# Patient Record
Sex: Male | Born: 1935 | Race: White | Hispanic: No | Marital: Married | Smoking: Never smoker
Health system: Southern US, Community
[De-identification: ages and names within clinical notes are randomized; demographics above are authoritative.]

## PROBLEM LIST (undated history)

## (undated) DIAGNOSIS — D759 Disease of blood and blood-forming organs, unspecified: Secondary | ICD-10-CM

## (undated) DIAGNOSIS — Z86718 Personal history of other venous thrombosis and embolism: Secondary | ICD-10-CM

## (undated) DIAGNOSIS — R52 Pain, unspecified: Secondary | ICD-10-CM

## (undated) DIAGNOSIS — R39198 Other difficulties with micturition: Secondary | ICD-10-CM

## (undated) DIAGNOSIS — H9190 Unspecified hearing loss, unspecified ear: Secondary | ICD-10-CM

## (undated) DIAGNOSIS — J189 Pneumonia, unspecified organism: Secondary | ICD-10-CM

## (undated) HISTORY — PX: JOINT REPLACEMENT: SHX530

---

## 2005-03-02 ENCOUNTER — Ambulatory Visit: Payer: Self-pay | Admitting: Internal Medicine

## 2005-05-04 ENCOUNTER — Ambulatory Visit (HOSPITAL_COMMUNITY): Admission: RE | Admit: 2005-05-04 | Discharge: 2005-05-04 | Payer: Self-pay | Admitting: Internal Medicine

## 2005-06-01 ENCOUNTER — Ambulatory Visit: Payer: Self-pay | Admitting: Internal Medicine

## 2006-02-11 ENCOUNTER — Inpatient Hospital Stay (HOSPITAL_COMMUNITY): Admission: RE | Admit: 2006-02-11 | Discharge: 2006-02-14 | Payer: Self-pay | Admitting: Orthopedic Surgery

## 2006-05-23 ENCOUNTER — Inpatient Hospital Stay (HOSPITAL_COMMUNITY): Admission: RE | Admit: 2006-05-23 | Discharge: 2006-05-26 | Payer: Self-pay | Admitting: Orthopedic Surgery

## 2012-10-17 ENCOUNTER — Other Ambulatory Visit: Payer: Self-pay | Admitting: Orthopedic Surgery

## 2012-10-17 ENCOUNTER — Encounter (HOSPITAL_COMMUNITY): Payer: Self-pay | Admitting: *Deleted

## 2012-10-17 ENCOUNTER — Encounter (HOSPITAL_COMMUNITY): Payer: Self-pay

## 2012-10-17 DIAGNOSIS — S76119A Strain of unspecified quadriceps muscle, fascia and tendon, initial encounter: Secondary | ICD-10-CM

## 2012-10-17 MED ORDER — DEXAMETHASONE SODIUM PHOSPHATE 10 MG/ML IJ SOLN: 10.0000 mg | Freq: Once | INTRAMUSCULAR | Status: DC

## 2012-10-17 MED ORDER — BUPIVACAINE LIPOSOME 1.3 % IJ SUSP: 20.0000 mL | Freq: Once | INTRAMUSCULAR | Status: DC

## 2012-10-17 NOTE — Progress Notes (Signed)
PT ADDED ON FOR SURGERY TOMORROW.  SPOKE WITH PT BY PHONE - HE IS HARD OF HEARING-REQUESTED I TALK WITH HIS DAUGHTER TANYA TANT - WHO IS THERE AT HIS HOUSE WITH HIM.  PT'S MEDICAL HX AND INSTRUCTIONS FOR SURGERY DISCUSSED WITH TANYA - INCLUDING  BATHING WITH HIBICLENS / PRECAUTIONS USING.

## 2012-10-17 NOTE — H&P (Signed)
  CC- Ian Cunningham is a 79 y.o. male who presents with left knee pain.  HPI- . Knee Pain: Patient presents with knee pain involving the  left knee. Onset of the symptoms was several days ago. Inciting event: was walking and doing stairs and developed weakness, felt a pop and developed significant swelling. Current symptoms include giving out, swelling and inability to bear weight or extend knee. Pain is aggravated by any weight bearing, standing and twisting while sitting or lying down.  Patient has had prior knee problems and had a total knee arthroplasty about 7 years ago with absolutely no problems until this episode. Evaluation to date: plain films: normal and ultrasound showing quad tendon rupture. Treatment to date: brace which is somewhat effective.  Past Medical History  Diagnosis Date  . History of blood clots     clots in legs and arms over the years- due to factor v leiden --no problems since taking coumadin --has been on coumadin for at least 10 yrs  . Blood dyscrasia     Factor V Leiden - takes chronic coumadin to prevent blood clots  . Pneumonia     about 2011  . Hard of hearing     does not have hearing aids  . Pain     left leg - ruptured quadriceps tendon--has immobilizer and crutches  . Difficulty in voiding     was put on flomax several years ago    Past Surgical History  Procedure Laterality Date  . Joint replacement      bilateral total knee replacements    Prior to Admission medications   Medication Sig Start Date End Date Taking? Authorizing Provider  Cyanocobalamin (VITAMIN B 12 PO) Take 1,000 mcg by mouth every morning.   Yes Historical Provider, MD  Oxycodone HCl 10 MG TABS Take by mouth. One to two capsules every 4 to 6 hours   Yes Historical Provider, MD  tamsulosin (FLOMAX) 0.4 MG CAPS Take by mouth. One at bedtime daily   Yes Historical Provider, MD  warfarin (COUMADIN) 5 MG tablet Take 2.5-5 mg by mouth See admin instructions. Pt takes 2.5mg  on Monday  Wednesday Friday and on the other days pt takes 5 mg.   Yes Historical Provider, MD   KNEE EXAM antalgic gait, soft tissue tenderness over anterior knee with palpable quad tendon defect, effusion, collateral ligaments intact, no warmth, erythema; inability to actively extend knee beyond 40 degrees with full passive extension Physical Examination: General appearance - alert, well appearing, and in no distress Mental status - alert, oriented to person, place, and time Chest - clear to auscultation, no wheezes, rales or rhonchi, symmetric air entry Heart - normal rate, regular rhythm, normal S1, S2, no murmurs, rubs, clicks or gallops Abdomen - soft, nontender, nondistended, no masses or organomegaly Neurological - alert, oriented, normal speech, no focal findings or movement disorder noted   Asessment/Plan--- Left knee quad tendon rupture- - Plan left knee quad tendon repair. Procedure risks and potential comps discussed with patient who elects to proceed. Goals are decreased pain and increased function with a high likelihood of achieving both

## 2012-10-18 ENCOUNTER — Encounter (HOSPITAL_COMMUNITY)
Admission: RE | Disposition: A | Discharge status: Home / Self Care | Payer: Self-pay | Source: Ambulatory Visit | Attending: Orthopedic Surgery

## 2012-10-18 ENCOUNTER — Encounter (HOSPITAL_COMMUNITY): Payer: Self-pay | Admitting: Certified Registered Nurse Anesthetist

## 2012-10-18 ENCOUNTER — Ambulatory Visit (HOSPITAL_COMMUNITY): Payer: Medicare Other | Admitting: Certified Registered Nurse Anesthetist

## 2012-10-18 ENCOUNTER — Ambulatory Visit (HOSPITAL_COMMUNITY): Payer: Medicare Other

## 2012-10-18 ENCOUNTER — Encounter (HOSPITAL_COMMUNITY): Payer: Self-pay | Admitting: *Deleted

## 2012-10-18 ENCOUNTER — Observation Stay (HOSPITAL_COMMUNITY)
Admission: RE | Admit: 2012-10-18 | Discharge: 2012-10-19 | Disposition: A | Discharge status: Home / Self Care | Payer: Medicare Other | Source: Ambulatory Visit | Attending: Orthopedic Surgery | Admitting: Orthopedic Surgery

## 2012-10-18 DIAGNOSIS — M25069 Hemarthrosis, unspecified knee: Secondary | ICD-10-CM | POA: Insufficient documentation

## 2012-10-18 DIAGNOSIS — M66259 Spontaneous rupture of extensor tendons, unspecified thigh: Principal | ICD-10-CM | POA: Insufficient documentation

## 2012-10-18 DIAGNOSIS — Z86718 Personal history of other venous thrombosis and embolism: Secondary | ICD-10-CM | POA: Insufficient documentation

## 2012-10-18 DIAGNOSIS — Z96659 Presence of unspecified artificial knee joint: Secondary | ICD-10-CM | POA: Insufficient documentation

## 2012-10-18 DIAGNOSIS — S76119A Strain of unspecified quadriceps muscle, fascia and tendon, initial encounter: Secondary | ICD-10-CM

## 2012-10-18 DIAGNOSIS — D6859 Other primary thrombophilia: Secondary | ICD-10-CM | POA: Insufficient documentation

## 2012-10-18 DIAGNOSIS — S8000XA Contusion of unspecified knee, initial encounter: Secondary | ICD-10-CM | POA: Insufficient documentation

## 2012-10-18 DIAGNOSIS — X58XXXA Exposure to other specified factors, initial encounter: Secondary | ICD-10-CM | POA: Insufficient documentation

## 2012-10-18 DIAGNOSIS — M25469 Effusion, unspecified knee: Secondary | ICD-10-CM | POA: Insufficient documentation

## 2012-10-18 DIAGNOSIS — Z7901 Long term (current) use of anticoagulants: Secondary | ICD-10-CM | POA: Insufficient documentation

## 2012-10-18 HISTORY — DX: Personal history of other venous thrombosis and embolism: Z86.718

## 2012-10-18 HISTORY — DX: Unspecified hearing loss, unspecified ear: H91.90

## 2012-10-18 HISTORY — PX: QUADRICEPS TENDON REPAIR: SHX756

## 2012-10-18 HISTORY — DX: Other difficulties with micturition: R39.198

## 2012-10-18 HISTORY — DX: Pain, unspecified: R52

## 2012-10-18 HISTORY — DX: Disease of blood and blood-forming organs, unspecified: D75.9

## 2012-10-18 HISTORY — DX: Pneumonia, unspecified organism: J18.9

## 2012-10-18 LAB — BASIC METABOLIC PANEL
BUN: 27 mg/dL — ABNORMAL HIGH (ref 6–23)
Calcium: 9 mg/dL (ref 8.4–10.5)
Creatinine, Ser: 1.32 mg/dL (ref 0.50–1.35)
GFR calc Af Amer: 58 mL/min — ABNORMAL LOW (ref >90)
GFR calc non Af Amer: 50 mL/min — ABNORMAL LOW (ref >90)

## 2012-10-18 LAB — CBC
HCT: 36.3 % — ABNORMAL LOW (ref 39.0–52.0)
MCH: 29.4 pg (ref 26.0–34.0)
MCHC: 33.3 g/dL (ref 30.0–36.0)
MCV: 88.1 fL (ref 78.0–100.0)
RDW: 12.8 % (ref 11.5–15.5)

## 2012-10-18 SURGERY — REPAIR, TENDON, QUADRICEPS
Anesthesia: General | Site: Knee | Laterality: Left | Wound class: Clean

## 2012-10-18 MED ORDER — METOCLOPRAMIDE HCL 10 MG PO TABS: 5.0000 mg | ORAL_TABLET | Freq: Three times a day (TID) | ORAL | Status: DC | PRN

## 2012-10-18 MED ORDER — VANCOMYCIN HCL IN DEXTROSE 1-5 GM/200ML-% IV SOLN
1000.0000 mg | Freq: Once | INTRAVENOUS | Status: AC
  Administered 2012-10-18: 1000 mg via INTRAVENOUS

## 2012-10-18 MED ORDER — VANCOMYCIN HCL IN DEXTROSE 1-5 GM/200ML-% IV SOLN
INTRAVENOUS | Status: AC
  Filled 2012-10-18: qty 200

## 2012-10-18 MED ORDER — ACETAMINOPHEN 10 MG/ML IV SOLN
INTRAVENOUS | Status: AC
  Filled 2012-10-18: qty 100

## 2012-10-18 MED ORDER — 0.9 % SODIUM CHLORIDE (POUR BTL) OPTIME
TOPICAL | Status: DC | PRN
  Administered 2012-10-18: 1000 mL

## 2012-10-18 MED ORDER — HYDROMORPHONE HCL PF 1 MG/ML IJ SOLN: 0.2500 mg | INTRAMUSCULAR | Status: DC | PRN

## 2012-10-18 MED ORDER — OXYCODONE HCL 5 MG PO TABS
5.0000 mg | ORAL_TABLET | ORAL | Status: DC | PRN
  Administered 2012-10-18 – 2012-10-19 (×3): 5 mg via ORAL
  Filled 2012-10-18: qty 2
  Filled 2012-10-18 (×2): qty 1

## 2012-10-18 MED ORDER — METHOCARBAMOL 100 MG/ML IJ SOLN
500.0000 mg | Freq: Four times a day (QID) | INTRAVENOUS | Status: DC | PRN
  Filled 2012-10-18: qty 5

## 2012-10-18 MED ORDER — LIDOCAINE HCL (CARDIAC) 20 MG/ML IV SOLN
INTRAVENOUS | Status: DC | PRN
  Administered 2012-10-18: 100 mg via INTRAVENOUS

## 2012-10-18 MED ORDER — PROPOFOL 10 MG/ML IV EMUL
INTRAVENOUS | Status: DC | PRN
  Administered 2012-10-18: 150 mg via INTRAVENOUS

## 2012-10-18 MED ORDER — CHLORHEXIDINE GLUCONATE 4 % EX LIQD: 60.0000 mL | Freq: Once | CUTANEOUS | Status: DC

## 2012-10-18 MED ORDER — COUMADIN BOOK
1.0000 | Freq: Once | Status: DC
  Filled 2012-10-18: qty 1

## 2012-10-18 MED ORDER — WARFARIN SODIUM 4 MG PO TABS
4.0000 mg | ORAL_TABLET | ORAL | Status: AC
  Administered 2012-10-18: 4 mg via ORAL
  Filled 2012-10-18: qty 1

## 2012-10-18 MED ORDER — CEFAZOLIN SODIUM-DEXTROSE 2-3 GM-% IV SOLR
2.0000 g | INTRAVENOUS | Status: AC
  Administered 2012-10-18: 2 g via INTRAVENOUS

## 2012-10-18 MED ORDER — WARFARIN - PHARMACIST DOSING INPATIENT: Freq: Every day | Status: DC

## 2012-10-18 MED ORDER — METHOCARBAMOL 500 MG PO TABS
500.0000 mg | ORAL_TABLET | Freq: Four times a day (QID) | ORAL | Status: DC | PRN
  Administered 2012-10-18: 500 mg via ORAL
  Filled 2012-10-18: qty 1

## 2012-10-18 MED ORDER — TEMAZEPAM 15 MG PO CAPS: 15.0000 mg | ORAL_CAPSULE | Freq: Every evening | ORAL | Status: DC | PRN

## 2012-10-18 MED ORDER — SODIUM CHLORIDE 0.9 % IV SOLN
INTRAVENOUS | Status: DC
  Administered 2012-10-18 (×3): via INTRAVENOUS

## 2012-10-18 MED ORDER — TRAMADOL HCL 50 MG PO TABS: 50.0000 mg | ORAL_TABLET | Freq: Four times a day (QID) | ORAL | Status: DC | PRN

## 2012-10-18 MED ORDER — CEFAZOLIN SODIUM-DEXTROSE 2-3 GM-% IV SOLR
INTRAVENOUS | Status: AC
  Filled 2012-10-18: qty 50

## 2012-10-18 MED ORDER — MUPIROCIN 2 % EX OINT
TOPICAL_OINTMENT | Freq: Two times a day (BID) | CUTANEOUS | Status: DC
  Administered 2012-10-18: 1 via NASAL
  Filled 2012-10-18: qty 22

## 2012-10-18 MED ORDER — DEXTROSE 5 % IV SOLN
3.0000 g | INTRAVENOUS | Status: DC
  Filled 2012-10-18: qty 3000

## 2012-10-18 MED ORDER — ONDANSETRON HCL 4 MG/2ML IJ SOLN
INTRAMUSCULAR | Status: DC | PRN
  Administered 2012-10-18: 4 mg via INTRAVENOUS

## 2012-10-18 MED ORDER — SODIUM CHLORIDE 0.9 % IJ SOLN
INTRAMUSCULAR | Status: DC | PRN
  Administered 2012-10-18: 19:00:00

## 2012-10-18 MED ORDER — FENTANYL CITRATE 0.05 MG/ML IJ SOLN
INTRAMUSCULAR | Status: DC | PRN
  Administered 2012-10-18 (×5): 50 ug via INTRAVENOUS

## 2012-10-18 MED ORDER — METOCLOPRAMIDE HCL 5 MG/ML IJ SOLN: 5.0000 mg | Freq: Three times a day (TID) | INTRAMUSCULAR | Status: DC | PRN

## 2012-10-18 MED ORDER — ACETAMINOPHEN 10 MG/ML IV SOLN: 1000.0000 mg | Freq: Once | INTRAVENOUS | Status: DC

## 2012-10-18 MED ORDER — TAMSULOSIN HCL 0.4 MG PO CAPS
0.4000 mg | ORAL_CAPSULE | Freq: Every day | ORAL | Status: DC
  Filled 2012-10-18: qty 1

## 2012-10-18 MED ORDER — MORPHINE SULFATE 2 MG/ML IJ SOLN: 1.0000 mg | INTRAMUSCULAR | Status: DC | PRN

## 2012-10-18 MED ORDER — EPHEDRINE SULFATE 50 MG/ML IJ SOLN
INTRAMUSCULAR | Status: DC | PRN
  Administered 2012-10-18: 10 mg via INTRAVENOUS

## 2012-10-18 MED ORDER — ACETAMINOPHEN 10 MG/ML IV SOLN
INTRAVENOUS | Status: DC | PRN
  Administered 2012-10-18: 1000 mg via INTRAVENOUS

## 2012-10-18 MED ORDER — DEXAMETHASONE SODIUM PHOSPHATE 10 MG/ML IJ SOLN
INTRAMUSCULAR | Status: DC | PRN
  Administered 2012-10-18: 10 mg via INTRAVENOUS

## 2012-10-18 MED ORDER — ONDANSETRON HCL 4 MG PO TABS: 4.0000 mg | ORAL_TABLET | Freq: Four times a day (QID) | ORAL | Status: DC | PRN

## 2012-10-18 MED ORDER — VANCOMYCIN HCL IN DEXTROSE 1-5 GM/200ML-% IV SOLN
1000.0000 mg | Freq: Two times a day (BID) | INTRAVENOUS | Status: AC
  Administered 2012-10-19: 1000 mg via INTRAVENOUS
  Filled 2012-10-18: qty 200

## 2012-10-18 MED ORDER — LACTATED RINGERS IV SOLN: INTRAVENOUS | Status: DC

## 2012-10-18 MED ORDER — LACTATED RINGERS IV SOLN
INTRAVENOUS | Status: DC
  Administered 2012-10-18: 17:00:00 via INTRAVENOUS

## 2012-10-18 MED ORDER — SODIUM CHLORIDE 0.9 % IV SOLN
INTRAVENOUS | Status: DC
  Administered 2012-10-18: 22:00:00 via INTRAVENOUS

## 2012-10-18 MED ORDER — BUPIVACAINE LIPOSOME 1.3 % IJ SUSP
20.0000 mL | Freq: Once | INTRAMUSCULAR | Status: DC
  Filled 2012-10-18: qty 20

## 2012-10-18 MED ORDER — MIDAZOLAM HCL 5 MG/5ML IJ SOLN
INTRAMUSCULAR | Status: DC | PRN
  Administered 2012-10-18: 2 mg via INTRAVENOUS

## 2012-10-18 MED ORDER — ONDANSETRON HCL 4 MG/2ML IJ SOLN: 4.0000 mg | Freq: Four times a day (QID) | INTRAMUSCULAR | Status: DC | PRN

## 2012-10-18 MED ORDER — WARFARIN VIDEO: Freq: Once | Status: DC

## 2012-10-18 SURGICAL SUPPLY — 48 items
BAG SPEC THK2 15X12 ZIP CLS (MISCELLANEOUS)
BAG ZIPLOCK 12X15 (MISCELLANEOUS) IMPLANT
BANDAGE ELASTIC 6 VELCRO ST LF (GAUZE/BANDAGES/DRESSINGS) ×2 IMPLANT
BANDAGE ESMARK 6X9 LF (GAUZE/BANDAGES/DRESSINGS) IMPLANT
BIT DRILL 2.8X5 QR DISP (BIT) ×2 IMPLANT
BNDG CMPR 9X6 STRL LF SNTH (GAUZE/BANDAGES/DRESSINGS)
BNDG ESMARK 6X9 LF (GAUZE/BANDAGES/DRESSINGS)
CLOTH BEACON ORANGE TIMEOUT ST (SAFETY) ×2 IMPLANT
DRAPE POUCH INSTRU U-SHP 10X18 (DRAPES) ×2 IMPLANT
DRAPE U-SHAPE 47X51 STRL (DRAPES) ×2 IMPLANT
DRSG EMULSION OIL 3X16 NADH (GAUZE/BANDAGES/DRESSINGS) ×2 IMPLANT
DRSG EMULSION OIL 3X3 NADH (GAUZE/BANDAGES/DRESSINGS) ×1 IMPLANT
DRSG PAD ABDOMINAL 8X10 ST (GAUZE/BANDAGES/DRESSINGS) ×1 IMPLANT
DURAPREP 26ML APPLICATOR (WOUND CARE) ×2 IMPLANT
ELECT REM PT RETURN 9FT ADLT (ELECTROSURGICAL) ×2
ELECTRODE REM PT RTRN 9FT ADLT (ELECTROSURGICAL) ×1 IMPLANT
EVACUATOR 1/8 PVC DRAIN (DRAIN) ×1 IMPLANT
GLOVE BIO SURGEON STRL SZ7.5 (GLOVE) ×2 IMPLANT
GLOVE BIO SURGEON STRL SZ8 (GLOVE) ×2 IMPLANT
GLOVE BIOGEL PI IND STRL 8 (GLOVE) ×2 IMPLANT
GLOVE BIOGEL PI INDICATOR 8 (GLOVE) ×2
GLOVE SURG SS PI 8.5 STRL IVOR (GLOVE) ×4
GLOVE SURG SS PI 8.5 STRL STRW (GLOVE) IMPLANT
GOWN BRE IMP PREV XXLGXLNG (GOWN DISPOSABLE) ×1 IMPLANT
GOWN STRL NON-REIN LRG LVL3 (GOWN DISPOSABLE) ×2 IMPLANT
GOWN STRL REIN XL XLG (GOWN DISPOSABLE) ×3 IMPLANT
IMMOBILIZER KNEE 20 (SOFTGOODS) ×2
IMMOBILIZER KNEE 20 THIGH 36 (SOFTGOODS) ×1 IMPLANT
KIT BASIN OR (CUSTOM PROCEDURE TRAY) ×2 IMPLANT
MANIFOLD NEPTUNE II (INSTRUMENTS) ×2 IMPLANT
NDL MA TROC 1/2 CIR (NEEDLE) ×1 IMPLANT
NEEDLE MA TROC 1/2 CIR (NEEDLE) IMPLANT
NS IRRIG 1000ML POUR BTL (IV SOLUTION) ×2 IMPLANT
PACK ICE MAXI GEL EZY WRAP (MISCELLANEOUS) ×2 IMPLANT
PACK TOTAL JOINT (CUSTOM PROCEDURE TRAY) ×2 IMPLANT
PAD ABD 7.5X8 STRL (GAUZE/BANDAGES/DRESSINGS) ×2 IMPLANT
PADDING CAST COTTON 6X4 STRL (CAST SUPPLIES) ×3 IMPLANT
PASSER SUT SWANSON 36MM LOOP (INSTRUMENTS) ×2 IMPLANT
POSITIONER SURGICAL ARM (MISCELLANEOUS) ×2 IMPLANT
SPONGE GAUZE 4X4 12PLY (GAUZE/BANDAGES/DRESSINGS) ×2 IMPLANT
STRIP CLOSURE SKIN 1/2X4 (GAUZE/BANDAGES/DRESSINGS) ×3 IMPLANT
SUT ETHIBOND 2 OS 4 DA (SUTURE) ×2 IMPLANT
SUT MNCRL AB 4-0 PS2 18 (SUTURE) ×2 IMPLANT
SUT VIC AB 0 CT1 27 (SUTURE) ×2
SUT VIC AB 0 CT1 27XBRD ANTBC (SUTURE) ×2 IMPLANT
SUT VIC AB 2-0 CT1 27 (SUTURE) ×6
SUT VIC AB 2-0 CT1 TAPERPNT 27 (SUTURE) ×2 IMPLANT
TOWEL OR 17X26 10 PK STRL BLUE (TOWEL DISPOSABLE) ×4 IMPLANT

## 2012-10-18 NOTE — Anesthesia Preprocedure Evaluation (Addendum)
Anesthesia Evaluation  Patient identified by MRN, date of birth, ID band Patient awake    Reviewed: Allergy & Precautions, H&P , NPO status , Patient's Chart, lab work & pertinent test results  Airway Mallampati: II TM Distance: >3 FB Neck ROM: full    Dental  (+) Edentulous Upper, Missing and Dental Advisory Given Only 2 teeth left on bottom:   Pulmonary neg pulmonary ROS,  breath sounds clear to auscultation  Pulmonary exam normal       Cardiovascular Exercise Tolerance: Good negative cardio ROS  Rhythm:regular Rate:Normal     Neuro/Psych negative neurological ROS  negative psych ROS   GI/Hepatic negative GI ROS, Neg liver ROS,   Endo/Other  negative endocrine ROS  Renal/GU negative Renal ROS  negative genitourinary   Musculoskeletal   Abdominal   Peds  Hematology negative hematology ROS (+) Blood dyscrasia, , factor V Leiden - on coumadin.  Multiple blood clots   Anesthesia Other Findings   Reproductive/Obstetrics negative OB ROS                          Anesthesia Physical Anesthesia Plan  ASA: III  Anesthesia Plan: General   Post-op Pain Management:    Induction: Intravenous  Airway Management Planned: LMA  Additional Equipment:   Intra-op Plan:   Post-operative Plan:   Informed Consent: I have reviewed the patients History and Physical, chart, labs and discussed the procedure including the risks, benefits and alternatives for the proposed anesthesia with the patient or authorized representative who has indicated his/her understanding and acceptance.   Dental Advisory Given  Plan Discussed with: CRNA and Surgeon  Anesthesia Plan Comments:         Anesthesia Quick Evaluation

## 2012-10-18 NOTE — Plan of Care (Signed)
Problem: Diagnosis - Type of Surgery Goal: General Surgical Patient Education (See Patient Education module for education specifics) 10/18/2012: Repair Left Quadriceps Tendon Repair

## 2012-10-18 NOTE — Transfer of Care (Signed)
Immediate Anesthesia Transfer of Care Note  Patient: Ian Cunningham  Procedure(s) Performed: Procedure(s) (LRB): REPAIR QUADRICEP TENDON (Left)  Patient Location: PACU  Anesthesia Type: General  Level of Consciousness: sedated, patient cooperative and responds to stimulaton  Airway & Oxygen Therapy: Patient Spontanous Breathing and Patient connected to face mask oxgen  Post-op Assessment: Report given to PACU RN and Post -op Vital signs reviewed and stable  Post vital signs: Reviewed and stable  Complications: No apparent anesthesia complications

## 2012-10-18 NOTE — Preoperative (Signed)
Beta Blockers   Reason not to administer Beta Blockers:Not Applicable 

## 2012-10-18 NOTE — Anesthesia Postprocedure Evaluation (Signed)
  Anesthesia Post-op Note  Patient: Ian Cunningham  Procedure(s) Performed: Procedure(s) (LRB): REPAIR QUADRICEP TENDON (Left)  Patient Location: PACU  Anesthesia Type: General  Level of Consciousness: awake and alert   Airway and Oxygen Therapy: Patient Spontanous Breathing  Post-op Pain: mild  Post-op Assessment: Post-op Vital signs reviewed, Patient's Cardiovascular Status Stable, Respiratory Function Stable, Patent Airway and No signs of Nausea or vomiting  Last Vitals:  Filed Vitals:   10/18/12 1429  BP: 140/55  Pulse: 69  Temp: 37.1 C  Resp: 20    Post-op Vital Signs: stable   Complications: No apparent anesthesia complications

## 2012-10-18 NOTE — Brief Op Note (Signed)
10/18/2012  7:18 PM  PATIENT:  Ian Cunningham  79 y.o. male  PRE-OPERATIVE DIAGNOSIS:  LEFT KNEE QUAD TENDON RUPTURE  POST-OPERATIVE DIAGNOSIS:  left knee quadriceps tendon rupture  PROCEDURE:  Repair partial tear left quadriceps tendon SURGEON:  Surgeon(s) and Role:    * Loanne Drilling, MD - Primary  PHYSICIAN ASSISTANT:   ASSISTANTS: Avel Peace, Pa-C   ANESTHESIA:   general  EBL:     BLOOD ADMINISTERED:none  DRAINS: (Medium) Hemovact drain(s) in the left knee with  Suction Open   LOCAL MEDICATIONS USED:  OTHER Exparel 20 ml  COUNTS:  YES  TOURNIQUET: 28 minutes @ 300 mm Hg  DICTATION: .Other Dictation: Dictation Number [id number]  PLAN OF CARE: Admit for overnight observation  PATIENT DISPOSITION:  PACU - hemodynamically stable.

## 2012-10-18 NOTE — Interval H&P Note (Signed)
History and Physical Interval Note:  10/18/2012 5:55 PM  Ian Cunningham  has presented today for surgery, with the diagnosis of LEFT KNEE QUAD TENDON RUPTURE  The various methods of treatment have been discussed with the patient and family. After consideration of risks, benefits and other options for treatment, the patient has consented to  Procedure(s): REPAIR QUADRICEP TENDON (Left) as a surgical intervention .  The patient's history has been reviewed, patient examined, no change in status, stable for surgery.  I have reviewed the patient's chart and labs.  Questions were answered to the patient's satisfaction.     Loanne Drilling

## 2012-10-19 ENCOUNTER — Encounter (HOSPITAL_COMMUNITY): Payer: Self-pay | Admitting: Orthopedic Surgery

## 2012-10-19 MED ORDER — METHOCARBAMOL 500 MG PO TABS: 500.0000 mg | ORAL_TABLET | Freq: Four times a day (QID) | ORAL | Status: DC | PRN

## 2012-10-19 MED ORDER — TRAMADOL HCL 50 MG PO TABS: 50.0000 mg | ORAL_TABLET | Freq: Four times a day (QID) | ORAL | Status: DC | PRN

## 2012-10-19 MED ORDER — POLYETHYLENE GLYCOL 3350 17 G PO PACK
17.0000 g | PACK | Freq: Every day | ORAL | Status: DC
  Administered 2012-10-19: 17 g via ORAL

## 2012-10-19 NOTE — Discharge Summary (Signed)
Physician Discharge Summary   Patient ID: Ian Cunningham MRN: [id number] DOB/AGE: [birth year] 77 y.o.  Admit date: 10/18/2012 Discharge date: 10/19/2012  Primary Diagnosis:  Left knee quadriceps tendon rupture  Admission Diagnoses:  Past Medical History  Diagnosis Date  . History of blood clots     clots in legs and arms over the years- due to factor v leiden --no problems since taking coumadin --has been on coumadin for at least 10 yrs  . Blood dyscrasia     Factor V Leiden - takes chronic coumadin to prevent blood clots  . Pneumonia     about 2011  . Hard of hearing     does not have hearing aids  . Pain     left leg - ruptured quadriceps tendon--has immobilizer and crutches  . Difficulty in voiding     was put on flomax several years ago   Discharge Diagnoses:   Principal Problem:   Quadriceps tendon rupture  Estimated body mass index is 31.49 kg/(m^2) as calculated from the following:   Height as of this encounter: 5\' 6"  (1.676 m).   Weight as of this encounter: 88.451 kg (195 lb).  Procedure:  Procedure(s) (LRB): REPAIR QUADRICEP TENDON (Left)   Consults: None  HPI: Ian Cunningham is a 79 year old male, had a left total knee  arthroplasty done approximately 7 years ago, and had no problems until  this past weekend. He developed a sudden onset of swelling and pain and  inability to extend his knee. He did not have a direct trauma, but did  feel as though he may have had a pop in the knee. He presents to the  office 2 days ago with a large hemarthrosis and inability to extend the  knee. The ultrasound was performed in the office showing a probable  quadriceps tendon rupture. He presents now for repair.  Laboratory Data: Admission on 10/18/2012  Component Date Value Range Status  . MRSA, PCR 10/18/2012 POSITIVE* NEGATIVE Final   Comment: RESULT CALLED TO, READ BACK BY AND VERIFIED WITH:                          SHEPHERD K ON [id number] AT 1700 BY INGLEE  . Staphylococcus  aureus 10/18/2012 POSITIVE* NEGATIVE Final   Comment:                                 The Xpert SA Assay (FDA                          approved for NASAL specimens                          in patients over 63 years of age),                          is one component of                          a comprehensive surveillance                          program.  Test performance has  been validated by [facility] for patients greater                          than or equal to 40 year old.                          It is not intended                          to diagnose infection nor to                          guide or monitor treatment.                          RESULT CALLED TO, READ BACK BY AND VERIFIED WITH:                          SHEPHERD K ON [id number] AT 1700 BY INGLEE  . WBC 10/18/2012 13.3* 4.0 - 10.5 K/uL Final  . RBC 10/18/2012 4.12* 4.22 - 5.81 MIL/uL Final  . Hemoglobin 10/18/2012 12.1* 13.0 - 17.0 g/dL Final  . HCT 62/13/0865 36.3* 39.0 - 52.0 % Final  . MCV 10/18/2012 88.1  78.0 - 100.0 fL Final  . MCH 10/18/2012 29.4  26.0 - 34.0 pg Final  . MCHC 10/18/2012 33.3  30.0 - 36.0 g/dL Final  . RDW 78/46/9629 12.8  11.5 - 15.5 % Final  . Platelets 10/18/2012 308  150 - 400 K/uL Final  . Sodium 10/18/2012 134* 135 - 145 mEq/L Final  . Potassium 10/18/2012 4.2  3.5 - 5.1 mEq/L Final  . Chloride 10/18/2012 98  96 - 112 mEq/L Final  . CO2 10/18/2012 27  19 - 32 mEq/L Final  . Glucose, Bld 10/18/2012 105* 70 - 99 mg/dL Final  . BUN 52/84/1324 27* 6 - 23 mg/dL Final  . Creatinine, Ser 10/18/2012 1.32  0.50 - 1.35 mg/dL Final  . Calcium 40/04/2724 9.0  8.4 - 10.5 mg/dL Final  . GFR calc non Af Amer 10/18/2012 50* >90 mL/min Final  . GFR calc Af Amer 10/18/2012 58* >90 mL/min Final   Comment:                                 The eGFR has been calculated                          using the CKD EPI equation.                          This  calculation has not been                          validated in all clinical                          situations.  eGFR's persistently                          <90 mL/min signify                          possible Chronic Kidney Disease.  Marland Kitchen Prothrombin Time 10/18/2012 26.1* 11.6 - 15.2 seconds Final  . INR 10/18/2012 2.54* 0.00 - 1.49 Final  . aPTT 10/18/2012 76* 24 - 37 seconds Final   Comment:                                 IF BASELINE aPTT IS ELEVATED,                          SUGGEST PATIENT RISK ASSESSMENT                          BE USED TO DETERMINE APPROPRIATE                          ANTICOAGULANT THERAPY.  . Prothrombin Time 10/19/2012 25.4* 11.6 - 15.2 seconds Final  . INR 10/19/2012 2.44* 0.00 - 1.49 Final     X-Rays:Dg Chest 2 View  10/18/2012  *RADIOLOGY REPORT*  Clinical Data: Preop for quadriceps tendon repair.  CHEST - 2 VIEW  Comparison: 06/04/2010  Findings: The cardiac silhouette, mediastinal and hilar contours are within normal limits and stable.  The lungs are clear acute process.  Minimal stable basilar scarring changes.  No infiltrates, edema or effusions.  The bony thorax is intact.  IMPRESSION: No acute cardiopulmonary findings.  No change since prior examination.   Original Report Authenticated By: Rudie Meyer, M.D.     EKG: Orders placed during the hospital encounter of 10/18/12  . EKG 12-LEAD  . EKG 12-LEAD  . EKG 12-LEAD     Hospital Course: Ian Cunningham is a 79 y.o. who was admitted to [facility]. They were brought to the operating room on 10/18/2012 and underwent Procedure(s): REPAIR QUADRICEP TENDON.  Patient tolerated the procedure well and was later transferred to the recovery room and then to the orthopaedic floor for postoperative care.  They were given PO and IV analgesics for pain control following their surgery.  They were given 24 hours of postoperative antibiotics of  Anti-infectives   Start     Dose/Rate Route  Frequency Ordered Stop   10/19/12 0600  ceFAZolin (ANCEF) 3 g in dextrose 5 % 50 mL IVPB  Status:  Discontinued     3 g 160 mL/hr over 30 Minutes Intravenous On call to O.R. 10/18/12 1422 10/18/12 1713   10/19/12 0600  ceFAZolin (ANCEF) IVPB 2 g/50 mL premix     2 g 100 mL/hr over 30 Minutes Intravenous On call to O.R. 10/18/12 1713 10/18/12 1841   10/19/12 0600  vancomycin (VANCOCIN) IVPB 1000 mg/200 mL premix     1,000 mg 200 mL/hr over 60 Minutes Intravenous Every 12 hours 10/18/12 2217 10/19/12 0634   10/18/12 1730  vancomycin (VANCOCIN) IVPB 1000 mg/200 mL premix     1,000 mg 200 mL/hr over 60 Minutes Intravenous  Once 10/18/12 1728 10/18/12 1833     and started back on his Coumadin.   PT was ordered for therapy eval.  Discharge  planning consulted to help with postop disposition and equipment needs.  Patient had a good night on the evening of surgery.  Family in room at bedside.Briefly discussed the surgical findings with the patient. He was found to have a large hematoma at time of surgery with a small incomplete tendon tear. His INR is still 2.44 after being off coumadin for about 5 days. He was likely supertherapeutic and bled into the knee.  They started to get up OOB with therapy on day one. Hemovac drain was pulled without difficulty.  Patient was seen in rounds and was ready to go home later that day.  He was recommended to see his Medical Physician on Monday to get his Protime checked.  He states that he normally gets it checked once a month but for some unknown reason, he became supertherapeutic on bled into the knee causing a massive hematoma. He alternates dosing os his coumadin with 2.5 mg and 5 mg.  Will on ly start him back on the 2.5 mg until he is seen back by his PCP for further treatment recommendations and follow up.   Discharge Medications: Prior to Admission medications   Medication Sig Start Date End Date Taking? Authorizing Provider  Cyanocobalamin (VITAMIN B 12 PO)  Take 1,000 mcg by mouth every morning.   Yes Historical Provider, MD  Oxycodone HCl 10 MG TABS Take by mouth. One to two capsules every 4 to 6 hours   Yes Historical Provider, MD  tamsulosin (FLOMAX) 0.4 MG CAPS Take by mouth. One at bedtime daily   Yes Historical Provider, MD  warfarin (COUMADIN) 5 MG tablet Take 2.5 mg by mouth daily until follow up with PCP.   Yes Historical Provider, MD  methocarbamol (ROBAXIN) 500 MG tablet Take 1 tablet (500 mg total) by mouth every 6 (six) hours as needed. 10/19/12   Alexzandrew Perkins, PA-C  traMADol (ULTRAM) 50 MG tablet Take 1-2 tablets (50-100 mg total) by mouth every 6 (six) hours as needed (mild pain). 10/19/12   Alexzandrew Julien Girt, PA-C    Diet: Regular diet Activity:WBAT Follow-up: next Tuesday on April 8th with Dr. Lequita Halt. Disposition - Home with family. Discharged Condition: stable   Discharge Orders   Future Orders Complete By Expires     Call MD / Call 911  As directed     Comments:      If you experience chest pain or shortness of breath, CALL 911 and be transported to the hospital emergency room.  If you develope a fever above 101 F, pus (white drainage) or increased drainage or redness at the wound, or calf pain, call your surgeon's office.    Change dressing  As directed     Comments:      The patient may remove the dressing on Friday 10/20/2012 and apply dressing daily with sterile 4 x 4 inch gauze dressing and apply TED hose. Do not submerge the incision under water.    Constipation Prevention  As directed     Comments:      Drink plenty of fluids.  Prune juice may be helpful.  You may use a stool softener, such as Colace (over the counter) 100 mg twice a day.  Use MiraLax (over the counter) for constipation as needed.    Diet Carb Modified  As directed     Diet general  As directed     Discharge instructions  As directed     Comments:      Pick up stool softner and  laxative for home. Do not submerge incision under water. May  shower. Continue to use ice for pain and swelling from surgery. Hip precautions.  Total Hip Protocol.  Take Xarelto for two and a half more weeks, then discontinue Xarelto.    Do not sit on low chairs, stoools or toilet seats, as it may be difficult to get up from low surfaces  As directed     Driving restrictions  As directed     Comments:      No driving until released by the physician.    Increase activity slowly as tolerated  As directed     Lifting restrictions  As directed     Comments:      No lifting until released by the physician.    Patient may shower  As directed     Comments:      You may shower without a dressing once there is no drainage.  Do not wash over the wound.  If drainage remains, do not shower until drainage stops.    TED hose  As directed     Comments:      Use stockings (TED hose) for 3 weeks on both leg(s).  You may remove them at night for sleeping.    Weight bearing as tolerated  As directed         Medication List    TAKE these medications       methocarbamol 500 MG tablet  Commonly known as:  ROBAXIN  Take 1 tablet (500 mg total) by mouth every 6 (six) hours as needed.     Oxycodone HCl 10 MG Tabs  Take by mouth. One to two capsules every 4 to 6 hours     tamsulosin 0.4 MG Caps  Commonly known as:  FLOMAX  Take by mouth. One at bedtime daily     traMADol 50 MG tablet  Commonly known as:  ULTRAM  Take 1-2 tablets (50-100 mg total) by mouth every 6 (six) hours as needed (mild pain).     VITAMIN B 12 PO  Take 1,000 mcg by mouth every morning.     warfarin 5 MG tablet  Commonly known as:  COUMADIN  Take 2.5-5 mg by mouth See admin instructions. Pt takes 2.5mg  on Monday Wednesday Friday and on the other days pt takes 5 mg.           Follow-up Information   Follow up with Loanne Drilling, MD On 10/24/2012. (Call office for appointment time.)    Contact information:   [address]  [address] [place] [address] [phone]       Follow up with [facility], MD. (Call for appointment on Monday toget Protime blood work checked.)    Contact information:   [address]  [place] [address] [phone]       Signed: Patrica Duel 10/19/2012, 9:13 AM

## 2012-10-19 NOTE — Progress Notes (Signed)
ANTICOAGULATION CONSULT NOTE - Initial Consult  Pharmacy Consult for warfarin Indication: VTE prophylaxis  Allergies  Allergen Reactions  . Celecoxib Other (See Comments)    Hallucinations, disorientation    Patient Measurements: Height: 5\' 6"  (167.6 cm) Weight: 195 lb (88.451 kg) IBW/kg (Calculated) : 63.8 Heparin Dosing Weight:   Vital Signs: Temp: 97.9 F (36.6 C) (04/03 0216) Temp src: Oral (04/03 0216) BP: 130/70 mmHg (04/03 0216) Pulse Rate: 68 (04/03 0216)  Labs:  Recent Labs  10/18/12 1458  HGB 12.1*  HCT 36.3*  PLT 308  APTT 76*  LABPROT 26.1*  INR 2.54*  CREATININE 1.32    Estimated Creatinine Clearance: 48.9 ml/min (by C-G formula based on Cr of 1.32).   Medical History: Past Medical History  Diagnosis Date  . History of blood clots     clots in legs and arms over the years- due to factor v leiden --no problems since taking coumadin --has been on coumadin for at least 10 yrs  . Blood dyscrasia     Factor V Leiden - takes chronic coumadin to prevent blood clots  . Pneumonia     about 2011  . Hard of hearing     does not have hearing aids  . Pain     left leg - ruptured quadriceps tendon--has immobilizer and crutches  . Difficulty in voiding     was put on flomax several years ago    Medications:  Scheduled:  . [COMPLETED]  ceFAZolin (ANCEF) IV  2 g Intravenous On Call to OR  . coumadin book  1 each Does not apply Once  . tamsulosin  0.4 mg Oral QPC supper  . [COMPLETED] vancomycin  1,000 mg Intravenous Once  . vancomycin  1,000 mg Intravenous Q12H  . [COMPLETED] warfarin  4 mg Oral NOW  . warfarin   Does not apply Once  . Warfarin - Pharmacist Dosing Inpatient   Does not apply q1800  . [DISCONTINUED] acetaminophen  1,000 mg Intravenous Once  . [DISCONTINUED] bupivacaine liposome  20 mL Infiltration Once  . [DISCONTINUED]  ceFAZolin (ANCEF) IV  3 g Intravenous On Call to OR  . [DISCONTINUED] chlorhexidine  60 mL Topical Once  .  [DISCONTINUED] mupirocin ointment   Nasal BID    Assessment: Patient with ortho surgery.  MD ordered warfarin per pharmacy.  Patient had repair of partial tear of left quadriceps tendon.  Goal of Therapy:  INR 2-3    Plan:  Start with Coumadin 4 mg tonight. Check PT/INR daily. Provide Coumadin education.   Darlina Guys, Jacquenette Shone Crowford 10/19/2012,2:50 AM

## 2012-10-19 NOTE — Care Management Note (Signed)
    Page 1 of 2   10/19/2012     3:21:34 PM   CARE MANAGEMENT NOTE 10/19/2012  Patient:  Ian Cunningham, Ian Cunningham   Account Number:  [id number]  Date Initiated:  10/19/2012  Documentation initiated by:  Colleen Can  Subjective/Objective Assessment:   dx quad tendon rupture-left and large hemarthrosis left knee;  tendon repair and irrigation debridement     Action/Plan:   Cm spoke with patient and family. Patient plans to return to his home in [place] where spouse and daughter will be caregivers. Pt will need bsc and RW   Anticipated DC Date:  10/19/2012   Anticipated DC Plan:  HOME W HOME HEALTH SERVICES      DC Planning Services  CM consult      [facility] Choice  HOME HEALTH  DURABLE MEDICAL EQUIPMENT   Choice offered to / List presented to:  C-4 Adult Children   DME arranged  3-N-1  Levan Hurst      DME agency  [facility]     HH arranged  HH-1 RN  HH-2 PT      [facility] agency  [facility]   Status of service:  Completed, signed off Medicare Important Message given?  NA - LOS <3 / Initial given by admissions (If response is "NO", the following Medicare IM given date fields will be blank) Date Medicare IM given:   Date Additional Medicare IM given:    Discharge Disposition:  HOME W HOME HEALTH SERVICES  Per UR Regulation:  Reviewed for med. necessity/level of care/duration of stay  If discussed at Long Length of Stay Meetings, dates discussed:    Comments:  10/19/2012 Colleen Can BSN RN CCM [phone] [facility] will provide [facility] for coumadin management and and HHpt eval per order. Start date for servicees 10/20/2012. Patient was given contact information for [facility] agency.

## 2012-10-19 NOTE — Evaluation (Signed)
Physical Therapy Evaluation Patient Details Name: Ian Cunningham MRN: [id number] DOB: 1932/07/26 Today's Date: 10/19/2012 Time: 4098-1191 PT Time Calculation (min): 21 min  PT Assessment / Plan / Recommendation Clinical Impression  s/p quad tendon repair will see 1 more session for further gait and stair training    PT Assessment  Patient needs continued PT services    Follow Up Recommendations  No PT follow up    Does the patient have the potential to tolerate intense rehabilitation      Barriers to Discharge        Equipment Recommendations   (3 in 1 )    Recommendations for Other Services     Frequency  (1 session)    Precautions / Restrictions Precautions Precautions: Other (comment) Precaution Comments: no left knee ROM Required Braces or Orthoses: Knee Immobilizer - Left Knee Immobilizer - Left: On at all times Restrictions Weight Bearing Restrictions: Yes LLE Weight Bearing: Weight bearing as tolerated   Pertinent Vitals/Pain       Mobility  Bed Mobility Bed Mobility: Supine to Sit Supine to Sit: 4: Min assist Details for Bed Mobility Assistance: min with LLE Transfers Transfers: Sit to Stand;Stand to Sit Sit to Stand: 4: Min guard Stand to Sit: 4: Min guard Details for Transfer Assistance: cues for hand placement Ambulation/Gait Ambulation/Gait Assistance: 4: Min guard Ambulation Distance (Feet): 140 Feet Assistive device: Rolling walker;4-wheeled walker Gait Pattern: Step-to pattern;Antalgic    Exercises Total Joint Exercises Ankle Circles/Pumps: AROM;Both;15 reps   PT Diagnosis: Difficulty walking  PT Problem List: Decreased strength;Decreased balance;Decreased mobility;Decreased knowledge of use of DME;Decreased knowledge of precautions PT Treatment Interventions: DME instruction;Gait training;Stair training;Therapeutic activities;Patient/family education   PT Goals Acute Rehab PT Goals PT Goal Formulation: With patient Time For Goal  Achievement: 10/20/12 Potential to Achieve Goals: Good Pt will go Sit to Stand: with supervision PT Goal: Sit to Stand - Progress: Goal set today Pt will go Stand to Sit: with supervision PT Goal: Stand to Sit - Progress: Goal set today Pt will Ambulate: 1 - 15 feet;with supervision;with rolling walker PT Goal: Ambulate - Progress: Goal set today Pt will Go Up / Down Stairs: 3-5 stairs;with min assist;with least restrictive assistive device PT Goal: Up/Down Stairs - Progress: Goal set today  Visit Information  Last PT Received On: 10/19/12 Assistance Needed: +1 PT/OT Co-Evaluation/Treatment: Yes    Subjective Data  Subjective:  I am good Patient Stated Goal: home    Prior Functioning  Home Living Lives With: Spouse Available Help at Discharge: Available 24 hours/day Type of Home: House Home Access: Stairs to enter Secretary/administrator of Steps: 4 Entrance Stairs-Rails: None Home Layout: One level Bathroom Shower/Tub: Engineer, manufacturing systems: Standard Home Adaptive Equipment: Environmental consultant - four wheeled;Shower chair with back Prior Function Level of Independence: Independent with assistive device(s) Able to Take Stairs?: Yes Driving: Yes Communication Communication: No difficulties Dominant Hand: Right    Cognition  Cognition Overall Cognitive Status: Appears within functional limits for tasks assessed/performed Arousal/Alertness: Awake/alert Orientation Level: Appears intact for tasks assessed Behavior During Session: [facility] for tasks performed    Extremity/Trunk Assessment Right Lower Extremity Assessment RLE ROM/Strength/Tone: [facility] for tasks assessed Left Lower Extremity Assessment LLE ROM/Strength/Tone: Unable to fully assess;Due to precautions   Balance    End of Session PT - End of Session Activity Tolerance: Patient tolerated treatment well Patient left: in chair;with call bell/phone within reach;with family/visitor present Nurse Communication: Mobility  status  GP Functional Assessment Tool Used: clinical  judgement Functional Limitation: Mobility: Walking and moving around Mobility: Walking and Moving Around Current Status [id number]): At least 1 percent but less than 20 percent impaired, limited or restricted Mobility: Walking and Moving Around Goal Status [id number]): At least 1 percent but less than 20 percent impaired, limited or restricted   [facility] 10/19/2012, 11:21 AM

## 2012-10-19 NOTE — Progress Notes (Signed)
   Subjective: 1 Day Post-Op Procedure(s) (LRB): REPAIR QUADRICEP TENDON (Left) Patient reports pain as mild.   Patient seen in rounds with Dr. Lequita Halt. Family in room at bedside.  Briefly discussed the surgical findings with the patient.  He was found to have a large hematoma at time of surgery with a small incomplete tendon tear.  His INR is still 2.44 after being off coumadin for about 5 days.  He was likely supertherapeutic and bled into the knee. Patient is well, and has had no acute complaints or problems Patient is ready to go home later today.  He will keep his knee immobilizer on at all times.  No bending or range of motion to the knee until seen back in the office next week (Tuesday).  Objective: Vital signs in last 24 hours: Temp:  [97.9 F (36.6 C)-98.8 F (37.1 C)] 98.1 F (36.7 C) (04/03 0545) Pulse Rate:  [60-100] 60 (04/03 0545) Resp:  [10-20] 14 (04/03 0545) BP: (130-170)/(55-76) 132/66 mmHg (04/03 0545) SpO2:  [96 %-99 %] 97 % (04/03 0545) Weight:  [88.451 kg (195 lb)] 88.451 kg (195 lb) (04/02 1431)  Intake/Output from previous day:  Intake/Output Summary (Last 24 hours) at 10/19/12 0834 Last data filed at 10/19/12 0800  Gross per 24 hour  Intake 2004.58 ml  Output    881 ml  Net 1123.58 ml    Intake/Output this shift: Total I/O In: 120 [P.O.:120] Out: -   Labs:  Recent Labs  10/18/12 1458  HGB 12.1*    Recent Labs  10/18/12 1458  WBC 13.3*  RBC 4.12*  HCT 36.3*  PLT 308    Recent Labs  10/18/12 1458  NA 134*  K 4.2  CL 98  CO2 27  BUN 27*  CREATININE 1.32  GLUCOSE 105*  CALCIUM 9.0    Recent Labs  10/18/12 1458 10/19/12 0350  INR 2.54* 2.44*    EXAM: General - Patient is Alert, Appropriate and Oriented Extremity - Neurovascular intact Sensation intact distally Dorsiflexion/Plantar flexion intact Incision - clean, dry, no drainage Motor Function - intact, moving foot and toes well on exam.   Assessment/Plan: 1 Day  Post-Op Procedure(s) (LRB): REPAIR QUADRICEP TENDON (Left) Procedure(s) (LRB): REPAIR QUADRICEP TENDON (Left) Past Medical History  Diagnosis Date  . History of blood clots     clots in legs and arms over the years- due to factor v leiden --no problems since taking coumadin --has been on coumadin for at least 10 yrs  . Blood dyscrasia     Factor V Leiden - takes chronic coumadin to prevent blood clots  . Pneumonia     about 2011  . Hard of hearing     does not have hearing aids  . Pain     left leg - ruptured quadriceps tendon--has immobilizer and crutches  . Difficulty in voiding     was put on flomax several years ago   Principal Problem:   Quadriceps tendon rupture  Estimated body mass index is 31.49 kg/(m^2) as calculated from the following:   Height as of this encounter: 5\' 6"  (1.676 m).   Weight as of this encounter: 88.451 kg (195 lb). Up with therapy Discharge Home Diet - Regular diet Follow up - Next Tuesday, April 8th Activity - WBAT, Knee Immobilizer At All Times Disposition - Home Condition Upon Discharge - Good D/C Meds - See DC Summary DVT Prophylaxis - Coumadin  Ian Cunningham 10/19/2012, 8:34 AM

## 2012-10-19 NOTE — Progress Notes (Signed)
ANTICOAGULATION CONSULT NOTE - Follow Up Consult  Pharmacy Consult for Warfarin Indication: Hx DVT, Factor V leiden   Allergies  Allergen Reactions  . Celecoxib Other (See Comments)    Hallucinations, disorientation    Patient Measurements: Height: 5\' 6"  (167.6 cm) Weight: 195 lb (88.451 kg) IBW/kg (Calculated) : 63.8   Vital Signs: Temp: 98.1 F (36.7 C) (04/03 0545) Temp src: Oral (04/03 0545) BP: 132/66 mmHg (04/03 0545) Pulse Rate: 60 (04/03 0545)  Labs:  Recent Labs  10/18/12 1458 10/19/12 0350  HGB 12.1*  --   HCT 36.3*  --   PLT 308  --   APTT 76*  --   LABPROT 26.1* 25.4*  INR 2.54* 2.44*  CREATININE 1.32  --     Estimated Creatinine Clearance: 48.9 ml/min (by C-G formula based on Cr of 1.32).   Medications:  Scheduled:  . [COMPLETED]  ceFAZolin (ANCEF) IV  2 g Intravenous On Call to OR  . coumadin book  1 each Does not apply Once  . tamsulosin  0.4 mg Oral QPC supper  . [COMPLETED] vancomycin  1,000 mg Intravenous Once  . [COMPLETED] vancomycin  1,000 mg Intravenous Q12H  . [COMPLETED] warfarin  4 mg Oral NOW  . warfarin   Does not apply Once  . Warfarin - Pharmacist Dosing Inpatient   Does not apply q1800  . [DISCONTINUED] acetaminophen  1,000 mg Intravenous Once  . [DISCONTINUED] bupivacaine liposome  20 mL Infiltration Once  . [DISCONTINUED]  ceFAZolin (ANCEF) IV  3 g Intravenous On Call to OR  . [DISCONTINUED] chlorhexidine  60 mL Topical Once  . [DISCONTINUED] mupirocin ointment   Nasal BID   Infusions:  . sodium chloride 75 mL/hr at 10/19/12 0600  . [DISCONTINUED] sodium chloride 75 mL/hr at 10/18/12 2215  . [DISCONTINUED] lactated ringers    . [DISCONTINUED] lactated ringers 75 mL/hr at 10/18/12 2015   PRN: methocarbamol (ROBAXIN) IV, methocarbamol, metoCLOPramide (REGLAN) injection, metoCLOPramide, morphine injection, ondansetron (ZOFRAN) IV, ondansetron, oxyCODONE, traMADol, [DISCONTINUED] 0.9 % irrigation (POUR BTL), [DISCONTINUED]  bupivacaine liposome (EXPAREL 1.3 %) with 0.9 % sodium chloride inj, [DISCONTINUED]  HYDROmorphone (DILAUDID) injection, [DISCONTINUED] temazepam  Assessment:  79 yo M  On chronic anticoagulation for Hx of DVTs and Factor V Leiden. Prior to admission patient reports warfarin dose as 2.5 mg on WMF and 5 mg on all other days.  Warfarin was held by orthopaedics when seen as an outpatient in preparation for repair of L knee tendon rupture.  Despite holding warfarin x 5 days (3/29-4/2), INR remained therapeutic on admission at 2.54 (INR 3/29 unknown).  Patient was found to have large hemarthrosis of the knee in OR on 4/3.   INR may continue to decline given hold x 5 days,  However also concern for bleeding given large hemathrosis in knee, and INR remaining at goal despite hold x 5 days.  Plan to dose conservatively initially with close INR f/u.    Goal of Therapy:  INR 2-3 Monitor platelets by anticoagulation protocol: Yes   Plan:  1.) Warfarin 2.5 mg tonight x 1 at 1800  2.) Daily PT/INR 3.) Recommend close INR f/u when discharged.   BorgerdingLoma Messing PharmD Pager #: [phone] 8:43 AM 10/19/2012

## 2012-10-19 NOTE — Evaluation (Addendum)
Occupational Therapy Evaluation Patient Details Name: Ian Cunningham MRN: [id number] DOB: 23-Aug-1932 Today's Date: 10/19/2012 Time: 4098-1191 OT Time Calculation (min): 22 min  OT Assessment / Plan / Recommendation Clinical Impression  Pt is s/p quad tendon repair and is doing well with ADL. Wife is able to assist at discharge and other family can help also. Pt plans to sponge bathe initially.    OT Assessment  Patient needs continued OT Services    Follow Up Recommendations  No OT follow up;Supervision/Assistance - 24 hour    Barriers to Discharge      Equipment Recommendations  3 in 1 bedside comode    Recommendations for Other Services    Frequency  Min 2X/week    Precautions / Restrictions Precautions Precautions: Other (comment) Precaution Comments: NO ROM to L knee Required Braces or Orthoses: Knee Immobilizer - Left Knee Immobilizer - Left: On at all times Restrictions Weight Bearing Restrictions: No LLE Weight Bearing: Weight bearing as tolerated        ADL  Eating/Feeding: Simulated;Independent Where Assessed - Eating/Feeding: Chair Grooming: Wash/dry hands;Set up Where Assessed - Grooming: Supported sitting Upper Body Bathing: Simulated;Chest;Right arm;Left arm;Abdomen;Set up Where Assessed - Upper Body Bathing: Unsupported sitting Lower Body Bathing: Simulated;Moderate assistance (for LEs due to no ROM L knee) Where Assessed - Lower Body Bathing: Supine, head of bed up Upper Body Dressing: Simulated;Set up Where Assessed - Upper Body Dressing: Unsupported sitting Lower Body Dressing: Performed;Maximal assistance Where Assessed - Lower Body Dressing: Supported sit to stand Toilet Transfer: Simulated;Min guard;Other (comment) (up for ambulation and then transfer to chair) Toilet Transfer Method: Sit to stand;Other (comment) (see above) Toileting - Clothing Manipulation and Hygiene: Simulated;Minimal assistance Where Assessed - Medical sales representative and Hygiene: Standing Equipment Used: Rolling walker ADL Comments: Pt plans to sponge bathe initially. They are not interested in AE as wife states she will assist with LB dressing/ADL. will need 3in1. Once short donned over KI, pt able to help pull up shorts in standing with min assist for pulling all the way up in the back and slight balance support.     OT Diagnosis: Generalized weakness  OT Problem List: Decreased strength;Decreased knowledge of use of DME or AE OT Treatment Interventions: Self-care/ADL training;DME and/or AE instruction;Therapeutic activities   OT Goals Acute Rehab OT Goals OT Goal Formulation: With patient/family Time For Goal Achievement: 10/26/12 Potential to Achieve Goals: Good ADL Goals Pt Will Perform Grooming: with supervision;Standing at sink ADL Goal: Grooming - Progress: Goal set today Pt Will Transfer to Toilet: with supervision;Ambulation;3-in-1 ADL Goal: Toilet Transfer - Progress: Goal set today Pt Will Perform Toileting - Clothing Manipulation: with supervision;Standing ADL Goal: Toileting - Clothing Manipulation - Progress: Goal set today  Visit Information  Last OT Received On: 10/19/12 Assistance Needed: +1 PT/OT Co-Evaluation/Treatment: Yes    Subjective Data  Subjective: I had my  knee done in the past Patient Stated Goal: home   Prior Functioning     Home Living Lives With: Spouse Available Help at Discharge: Available 24 hours/day Type of Home: House Home Access: Stairs to enter [employer] of Steps: 4 Entrance Stairs-Rails: None Home Layout: One level Bathroom Shower/Tub: Engineer, manufacturing systems: Standard Home Adaptive Equipment: Environmental consultant - four wheeled;Shower chair with back Prior Function Level of Independence: Independent with assistive device(s) Able to Take Stairs?: Yes Driving: Yes Communication Communication: No difficulties Dominant Hand: Right         Vision/Perception      Cognition  Cognition Overall Cognitive Status: Appears within functional limits for tasks assessed/performed Arousal/Alertness: Awake/alert Orientation Level: Appears intact for tasks assessed Behavior During Session: [facility] for tasks performed    Extremity/Trunk Assessment Right Upper Extremity Assessment RUE ROM/Strength/Tone: Within functional levels Left Upper Extremity Assessment LUE ROM/Strength/Tone: Within functional levels Right Lower Extremity Assessment RLE ROM/Strength/Tone: WFL for tasks assessed Left Lower Extremity Assessment LLE ROM/Strength/Tone: Unable to fully assess;Due to precautions     Mobility Bed Mobility Bed Mobility: Supine to Sit Supine to Sit: 4: Min assist Details for Bed Mobility Assistance: min with LLE Transfers Sit to Stand: 4: Min guard Stand to Sit: 5: Min guard Details for Transfer Assistance: cues for hand placement        Balance Balance Balance Assessed: Yes Dynamic Standing Balance Dynamic Standing - Level of Assistance: 4: Min assist   End of Session OT - End of Session Activity Tolerance: Patient tolerated treatment well Patient left: in chair;with call bell/phone within reach;with family/visitor present  GO Functional Assessment Tool Used: clinical judgement Functional Limitation: Self care Self Care Current Status (Z6109): At least 1 percent but less than 20 percent impaired, limited or restricted Self Care Goal Status (U0454): At least 1 percent but less than 20 percent impaired, limited or restricted   Lennox Laity [phone] 10/19/2012, 1:13 PM

## 2012-10-19 NOTE — Op Note (Signed)
NAME:  Ian, Cunningham NO.:  [id number]  MEDICAL RECORD NO.:  [id number]  LOCATION:  1601                         FACILITY:  [facility]  PHYSICIAN:  Ollen Gross, M.D.    DATE OF BIRTH:  1933-07-09  DATE OF PROCEDURE:  10/18/2012 DATE OF DISCHARGE:                              OPERATIVE REPORT   PREOPERATIVE DIAGNOSIS:  Left knee quadriceps tendon rupture.  POSTOPERATIVE DIAGNOSIS:  Partial rupture left quadriceps tendon with large hemarthrosis of the knee.  PROCEDURE:  Left knee partial quad tendon repairs arthrotomy, and irrigation debridement.  SURGEON:  Ollen Gross, M.D.  ASSISTANT:  Alexzandrew L. Perkins, P.A.C.  ANESTHESIA:  General.  ESTIMATED BLOOD LOSS:  Minimal.  DRAINS:  Hemovac x1.  TOURNIQUET TIME:  28 minutes at 300 mmHg.  COMPLICATIONS:  None.  CONDITION:  Stable recovering.  CLINICAL NOTE:  Mr. Ian Cunningham is a 79 year old male, had a left total knee arthroplasty done approximately 7 years ago, and had no problems until this past weekend.  He developed a sudden onset of swelling and pain and inability to extend his knee.  He did not have a direct trauma, but did feel as though he may have had a pop in the knee.  He presents to the office 2 days ago with a large hemarthrosis and inability to extend the knee.  The ultrasound was performed in the office showing a probable quadriceps tendon rupture.  He presents now for repair.  PROCEDURE IN DETAIL:  After successful administration of general anesthetic, a tourniquet was placed on his left thigh and his left lower extremity prepped and draped in usual sterile fashion.  Extremities wrapped in Esmarch, and tourniquet inflated 300 mmHg.  A midline incision was made with a #10 blade through subcutaneous tissue to the level of the extensor mechanism.  No evidence of any gross tear in the quad.  I palpated all along the tendon and there was 1 area quite lateral in quad that the tendon felt thin.   In addition, there was a small tear in the VMO muscle, but none in the tendon itself.  I used a fresh blade to make a small medial arthrotomy to evacuate the large hematoma for about 400 mL of nonbloody in there.  We thoroughly irrigated the joint with a 1 L of saline.  I then palpated the underside of the quad tendon in that area where it was thin laterally and did not show full-thickness tear, but it was partial.  I then used #2 FiberWire suture to repair the tendon and reinforce it in pants-over-vest type fashion.  This restored the full thickness of the tendon.  I flexed the knee 100 degrees and it did not rupture.  We then irrigated further and then closed the arthrotomy with FiberWire suture.  I also fixed the partial muscle tear with the FiberWire.  I placed a Hemovac drain in the joint exiting laterally prior to fully closing the knee joint.  The subcu was then closed with interrupted 2-0 Vicryl and subcuticular running 4-0 Monocryl.  Incisions cleaned and dried, and Steri-Strips and a bulky sterile dressing applied.  Drains hooked to suction.  The tourniquet was released for  a total time of 28 minutes.  He was then placed into a knee immobilizer awakened and transported to recovery in stable condition.     Ollen Gross, M.D.     FA/MEDQ  D:  10/18/2012  T:  10/19/2012  Job:  [id number]

## 2012-10-19 NOTE — Progress Notes (Signed)
10/19/12 1200  PT Visit Information  Last PT Received On 10/19/12  Assistance Needed +1  PT Time Calculation  PT Start Time 1211  PT Stop Time 1235  PT Time Calculation (min) 24 min  Subjective Data  Subjective ok  Patient Stated Goal home   Precautions  Precaution Comments no left knee ROM  Required Braces or Orthoses Knee Immobilizer - Left  Knee Immobilizer - Left On at all times  Restrictions  LLE Weight Bearing WBAT  Cognition  Overall Cognitive Status Appears within functional limits for tasks assessed/performed  Arousal/Alertness Awake/alert  Orientation Level Appears intact for tasks assessed  Behavior During Session [facility] for tasks performed  Transfers  Transfers Sit to Stand;Stand to Sit  Sit to Stand 4: Min guard;5: Supervision  Stand to Sit 5: Supervision  Details for Transfer Assistance cues for hand placement  Ambulation/Gait  Ambulation/Gait Assistance 5: Supervision  Ambulation Distance (Feet) 220 Feet  Assistive device 4-wheeled walker  Ambulation/Gait Assistance Details cues for step through  Gait Pattern Step-to pattern;Step-through pattern;Antalgic  Stairs Yes  Stairs Assistance 4: Min guard  Stair Management Technique With crutches;No rails;Forwards  Number of Stairs 4  PT - End of Session  Equipment Utilized During Treatment Gait belt;Left knee immobilizer  Activity Tolerance Patient tolerated treatment well  Patient left in chair;with call bell/phone within reach;with family/visitor present  PT - Assessment/Plan  Comments on Treatment Session pt progressing well  PT Plan Discharge plan remains appropriate;Frequency remains appropriate  Follow Up Recommendations No PT follow up  PT equipment Rolling walker with 5" wheels  Acute Rehab PT Goals  Time For Goal Achievement 10/20/12  Potential to Achieve Goals Good  Pt will go Sit to Stand with supervision  PT Goal: Sit to Stand - Progress Met  Pt will go Stand to Sit with supervision  PT Goal:  Stand to Sit - Progress Met  Pt will Ambulate 1 - 15 feet;with supervision;with rolling walker  PT Goal: Ambulate - Progress Met  Pt will Go Up / Down Stairs 3-5 stairs;with min assist;with least restrictive assistive device  PT Goal: Up/Down Stairs - Progress Met  PT G-Codes **NOT FOR INPATIENT CLASS**  Functional Assessment Tool Used clinical judgement  Mobility: Walking and Moving Around Goal Status (Z6109) CI  Mobility: Walking and Moving Around Discharge Status (U0454) CI  PT General Charges  $$ ACUTE PT VISIT 1 Procedure  PT Treatments  $Gait Training 23-37 mins

## 2013-12-15 IMAGING — CR DG CHEST 2V
1 series · 1 of 1 positions shown · non-contrast
Comparison: 06/04/2010

CLINICAL DATA: Preop for quadriceps tendon repair.

CHEST - 2 VIEW

[view not recorded]
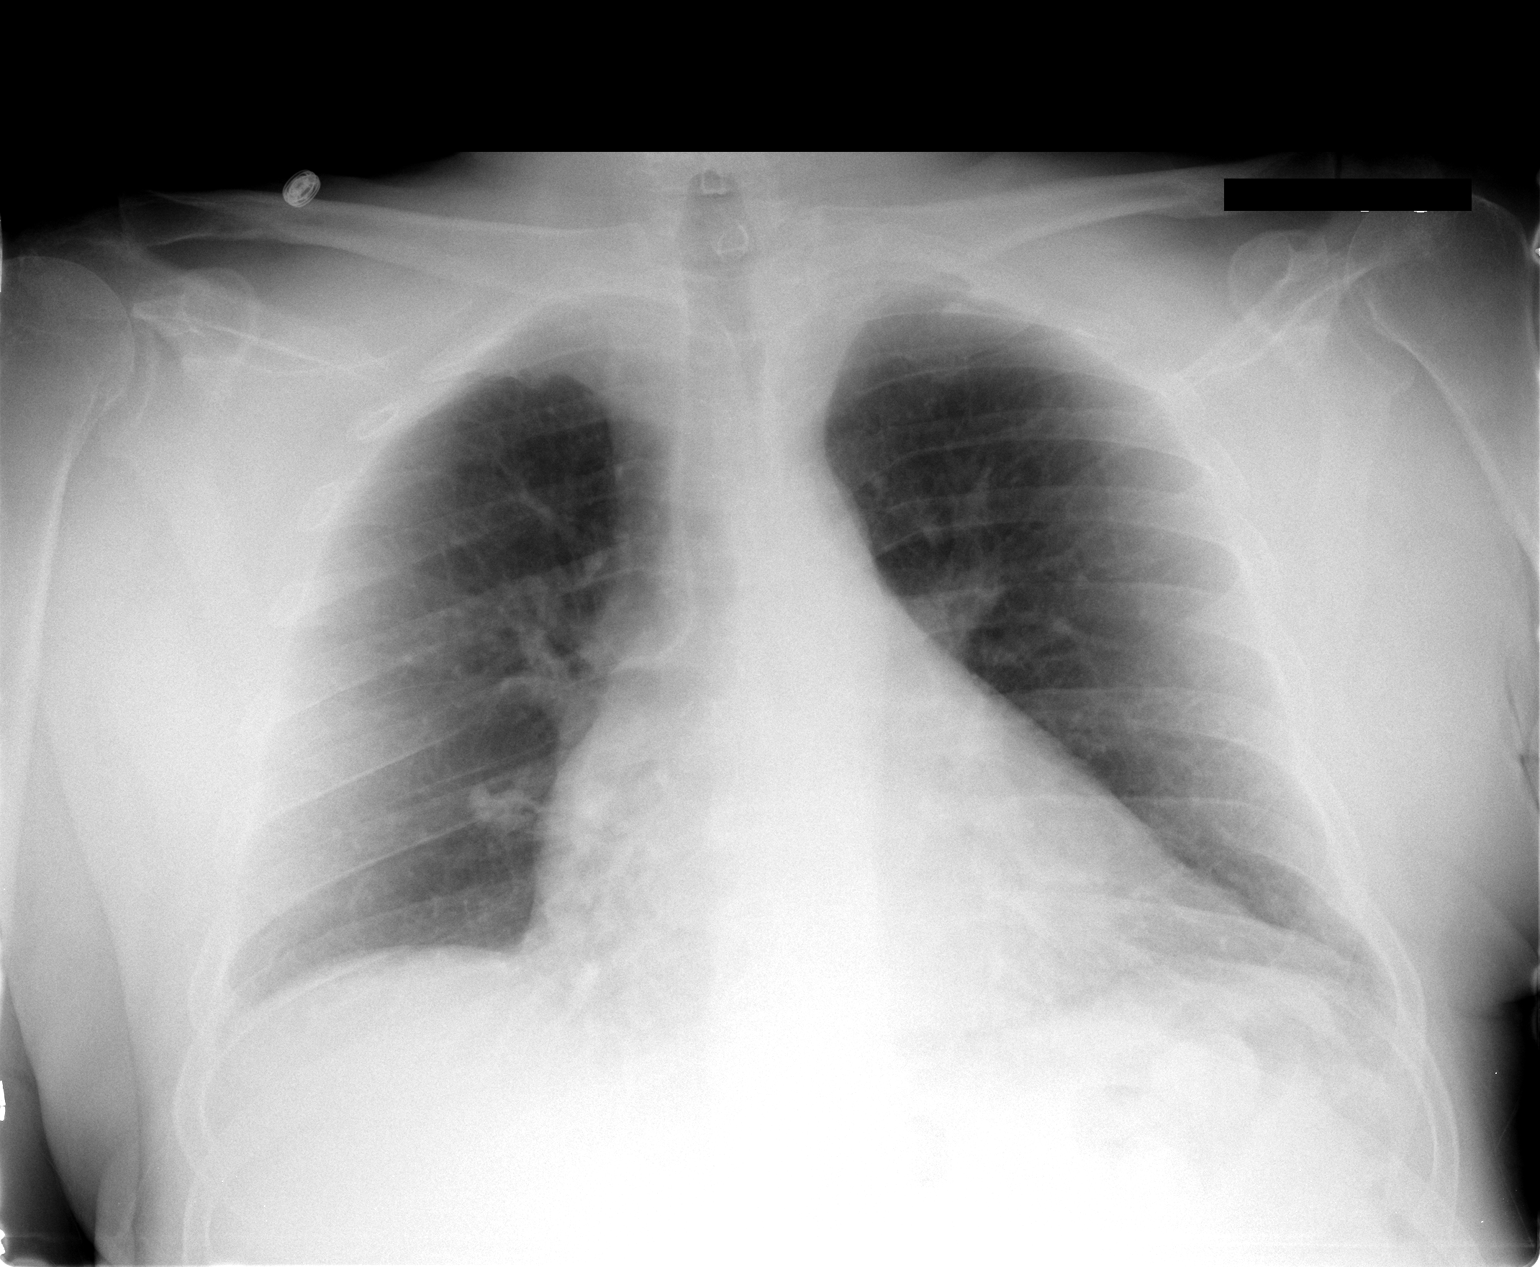

[1 of 1 positions shown; findings below may reference images not displayed]

FINDINGS: The cardiac silhouette, mediastinal and hilar contours
are within normal limits and stable.  The lungs are clear acute
process.  Minimal stable basilar scarring changes.  No infiltrates,
edema or effusions.  The bony thorax is intact.
IMPRESSION: No acute cardiopulmonary findings.  No change since prior
examination.

## 2013-12-15 IMAGING — CR DG CHEST 2V
1 series · 1 of 1 positions shown · non-contrast
Comparison: 06/04/2010

CLINICAL DATA: Preop for quadriceps tendon repair.

CHEST - 2 VIEW

[w chest lat]
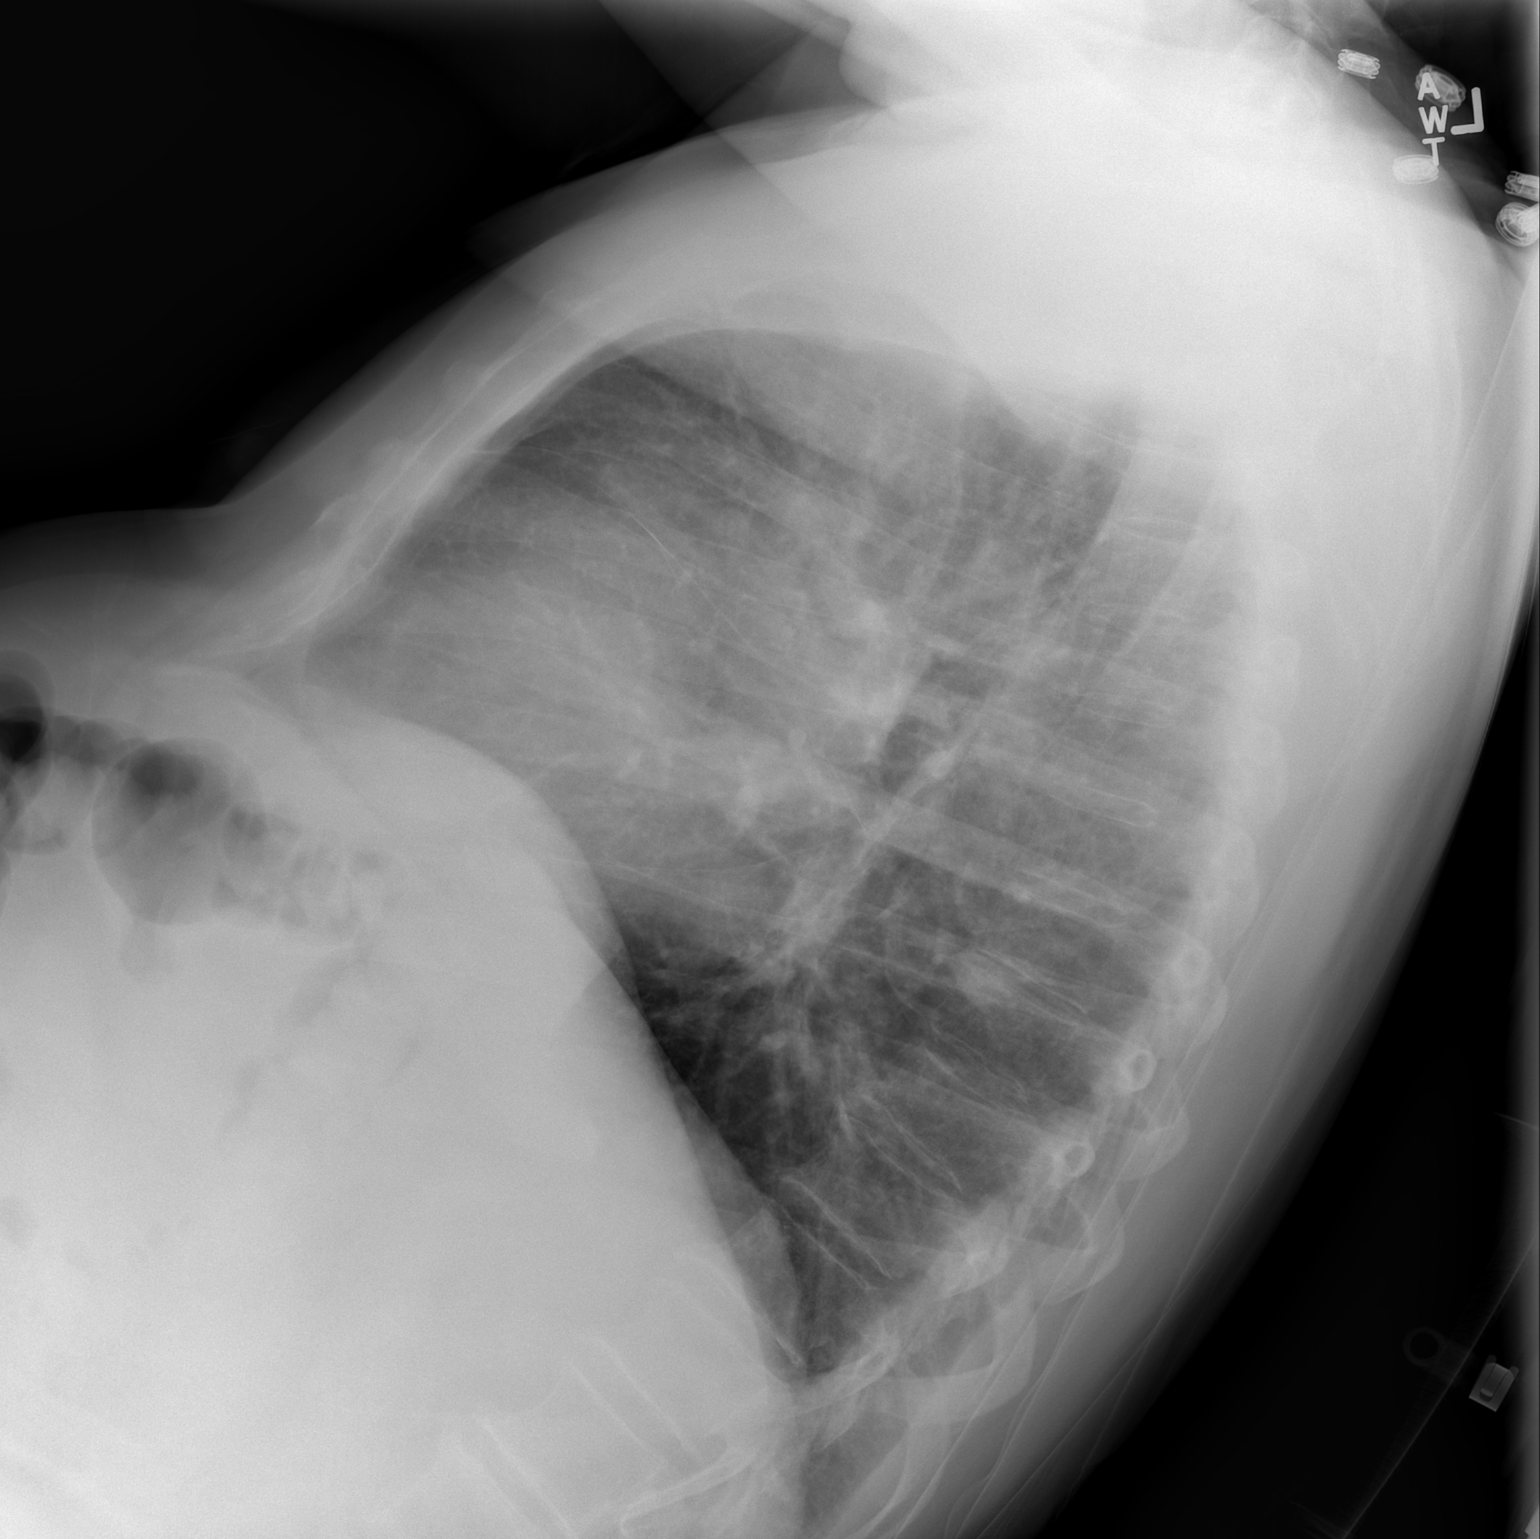

[1 of 1 positions shown; findings below may reference images not displayed]

FINDINGS: The cardiac silhouette, mediastinal and hilar contours
are within normal limits and stable.  The lungs are clear acute
process.  Minimal stable basilar scarring changes.  No infiltrates,
edema or effusions.  The bony thorax is intact.
IMPRESSION: No acute cardiopulmonary findings.  No change since prior
examination.

## 2016-10-29 NOTE — Patient Instructions (Signed)
Kier Smead Curiale  10/29/2016     @   Your procedure is scheduled on 11/11/2016.  Report to [facility] at 7:30 A.M.  Call this number if you have problems the morning of surgery:  [phone]   Remember:  Do not eat food or drink liquids after midnight.  Take these medicines the morning of surgery with A SIP OF WATER : Robaxin, Ultram and Flomax   Do not wear jewelry, make-up or nail polish.  Do not wear lotions, powders, or perfumes, or deoderant.  Do not shave 48 hours prior to surgery.  Men may shave face and neck.  Do not bring valuables to the hospital.  [facility] is not responsible for any belongings or valuables.  Contacts, dentures or bridgework may not be worn into surgery.  Leave your suitcase in the car.  After surgery it may be brought to your room.  For patients admitted to the hospital, discharge time will be determined by your treatment team.  Patients discharged the day of surgery will not be allowed to drive home.   Name and phone number of your driver:   family Special instructions:  n/a  Please read over the following fact sheets that you were given. Care and Recovery After Surgery    Cataract Surgery Cataract surgery is a procedure to remove a cataract from your eye. A cataract is cloudiness on the lens of your eye. The lens focuses light inside the eye. When a lens becomes cloudy, your vision is affected. Cataract surgery is a procedure to remove the cloudy lens. A substitute lens (intraocular lens or IOL) is usually inserted as a replacement for the cloudy lens. Tell a health care provider about:  Any allergies you have.  All medicines you are taking, including vitamins, herbs, eye drops, creams, and over-the-counter medicines.  Any problems you or family members have had with anesthetic medicines.  Any blood disorders you have.  Any surgeries you have had, especially eye surgeries that include refractive surgery, such as PRK  and LASIK.  Any medical conditions you have.  Whether you are pregnant or may be pregnant. What are the risks? Generally, this is a safe procedure. However, problems may occur, including:  Infection.  Bleeding.  Glaucoma.  Retinal detachment.  Allergic reactions to medicines.  Damage to other structures or organs.  Inflammation of the eye.  Clouding of the part of your eye that holds an IOL in place (after-cataract), if an IOL was inserted. This is fairly common.  An IOL moving out of position, if an IOL was inserted. This is very rare.  Loss of vision. This is rare. What happens before the procedure?  Follow instructions from your health care provider about eating or drinking restrictions.  Ask your health care provider about:  Changing or stopping your regular medicines, including any eye drops you have been prescribed. This is especially important if you are taking diabetes medicines or blood thinners.  Taking medicines such as aspirin and ibuprofen. These medicines can thin your blood. Do not take these medicines before your procedure if your health care provider instructs you not to.  Do not put contact lenses in either eye on the day of your surgery.  Plan for someone to drive you to and from the procedure.  If you will be going home right after the procedure, plan to have someone with you for 24 hours. What happens during the procedure?  An IV tube may be inserted into one  of your veins.  You will be given one or more of the following:  A medicine to help you relax (sedative).  A medicine to numb the area (local anesthetic). This may be numbing eye drops or an injection that is given behind the eye.  A small cut (incision) will be made to the edge of the clear, dome-shaped surface that covers the front of the eye (cornea).  A small probe will be inserted into the eye. This device gives off ultrasound waves that soften and break up the cloudy center of the  lens. This makes it easier for the cloudy lens to be removed by suction.  An IOL may be implanted.  Part of the capsule that surrounds the lens will be left in the eye to support the IOL.  Your surgeon may use stitches (sutures) to close the incision. The procedure may vary among health care providers and hospitals. What happens after the procedure?  Your blood pressure, heart rate, breathing rate, and blood oxygen level will be monitored often until the medicines you were given have worn off.  You may be given a protective shield to wear over your eyes.  Do not drive for 24 hours if you received a sedative. This information is not intended to replace advice given to you by your health care provider. Make sure you discuss any questions you have with your health care provider. Document Released: 06/24/2011 Document Revised: 12/11/2015 Document Reviewed: 05/15/2015 Elsevier Interactive Patient Education  2017 [employer].

## 2016-11-02 ENCOUNTER — Encounter (HOSPITAL_COMMUNITY): Payer: Self-pay

## 2016-11-02 ENCOUNTER — Encounter (HOSPITAL_COMMUNITY)
Admission: RE | Admit: 2016-11-02 | Discharge: 2016-11-02 | Disposition: A | Discharge status: Home / Self Care | Payer: Medicare Other | Source: Ambulatory Visit | Attending: Ophthalmology | Admitting: Ophthalmology

## 2016-11-02 DIAGNOSIS — Z01812 Encounter for preprocedural laboratory examination: Secondary | ICD-10-CM | POA: Diagnosis not present

## 2016-11-02 DIAGNOSIS — Z01818 Encounter for other preprocedural examination: Secondary | ICD-10-CM | POA: Diagnosis not present

## 2016-11-02 DIAGNOSIS — H2512 Age-related nuclear cataract, left eye: Secondary | ICD-10-CM | POA: Diagnosis not present

## 2016-11-02 LAB — BASIC METABOLIC PANEL
Anion gap: 8 (ref 5–15)
BUN: 14 mg/dL (ref 6–20)
CHLORIDE: 103 mmol/L (ref 101–111)
CO2: 26 mmol/L (ref 22–32)
Calcium: 8.9 mg/dL (ref 8.9–10.3)
Creatinine, Ser: 1.16 mg/dL (ref 0.61–1.24)
GFR calc Af Amer: >60 mL/min (ref >60)
GFR calc non Af Amer: 57 mL/min — ABNORMAL LOW (ref >60)
GLUCOSE: 116 mg/dL — AB (ref 65–99)
POTASSIUM: 4.1 mmol/L (ref 3.5–5.1)
Sodium: 137 mmol/L (ref 135–145)

## 2016-11-02 LAB — SURGICAL PCR SCREEN
MRSA, PCR: POSITIVE — AB
Staphylococcus aureus: POSITIVE — AB

## 2016-11-02 LAB — CBC
HEMATOCRIT: 35.3 % — AB (ref 39.0–52.0)
HEMOGLOBIN: 11.3 g/dL — AB (ref 13.0–17.0)
MCH: 27 pg (ref 26.0–34.0)
MCHC: 32 g/dL (ref 30.0–36.0)
MCV: 84.2 fL (ref 78.0–100.0)
Platelets: 262 10*3/uL (ref 150–400)
RBC: 4.19 MIL/uL — AB (ref 4.22–5.81)
RDW: 14.3 % (ref 11.5–15.5)
WBC: 9.5 10*3/uL (ref 4.0–10.5)

## 2016-11-02 MED ORDER — MUPIROCIN 2 % EX OINT
TOPICAL_OINTMENT | CUTANEOUS | Status: AC
  Filled 2016-11-02: qty 22

## 2016-11-11 ENCOUNTER — Ambulatory Visit (HOSPITAL_COMMUNITY): Payer: Medicare Other | Admitting: Anesthesiology

## 2016-11-11 ENCOUNTER — Ambulatory Visit (HOSPITAL_COMMUNITY)
Admission: RE | Admit: 2016-11-11 | Discharge: 2016-11-11 | Disposition: A | Discharge status: Home / Self Care | Payer: Medicare Other | Source: Ambulatory Visit | Attending: Ophthalmology | Admitting: Ophthalmology

## 2016-11-11 ENCOUNTER — Encounter (HOSPITAL_COMMUNITY): Payer: Self-pay | Admitting: *Deleted

## 2016-11-11 ENCOUNTER — Encounter (HOSPITAL_COMMUNITY)
Admission: RE | Disposition: A | Discharge status: Home / Self Care | Payer: Self-pay | Source: Ambulatory Visit | Attending: Ophthalmology

## 2016-11-11 DIAGNOSIS — D759 Disease of blood and blood-forming organs, unspecified: Secondary | ICD-10-CM | POA: Diagnosis not present

## 2016-11-11 DIAGNOSIS — H2512 Age-related nuclear cataract, left eye: Secondary | ICD-10-CM | POA: Insufficient documentation

## 2016-11-11 HISTORY — PX: CATARACT EXTRACTION W/PHACO: SHX586

## 2016-11-11 SURGERY — PHACOEMULSIFICATION, CATARACT, WITH IOL INSERTION
Anesthesia: Monitor Anesthesia Care | Site: Eye | Laterality: Left

## 2016-11-11 MED ORDER — FENTANYL CITRATE (PF) 100 MCG/2ML IJ SOLN
25.0000 ug | Freq: Once | INTRAMUSCULAR | Status: AC
  Administered 2016-11-11: 25 ug via INTRAVENOUS

## 2016-11-11 MED ORDER — NEOMYCIN-POLYMYXIN-DEXAMETH 3.5-10000-0.1 OP SUSP
OPHTHALMIC | Status: DC | PRN
  Administered 2016-11-11: 2 [drp] via OPHTHALMIC

## 2016-11-11 MED ORDER — MIDAZOLAM HCL 2 MG/2ML IJ SOLN
1.0000 mg | INTRAMUSCULAR | Status: AC
  Administered 2016-11-11: 2 mg via INTRAVENOUS

## 2016-11-11 MED ORDER — LIDOCAINE HCL (PF) 1 % IJ SOLN
INTRAOCULAR | Status: DC | PRN
  Administered 2016-11-11: 09:00:00 via OPHTHALMIC
  Administered 2016-11-11: 1 mL via OPHTHALMIC

## 2016-11-11 MED ORDER — LIDOCAINE HCL (PF) 1 % IJ SOLN: INTRAMUSCULAR | Status: DC | PRN

## 2016-11-11 MED ORDER — POVIDONE-IODINE 5 % OP SOLN
OPHTHALMIC | Status: DC | PRN
  Administered 2016-11-11: 1 via OPHTHALMIC

## 2016-11-11 MED ORDER — EPINEPHRINE PF 1 MG/ML IJ SOLN
INTRAOCULAR | Status: DC | PRN
  Administered 2016-11-11: 09:00:00

## 2016-11-11 MED ORDER — FENTANYL CITRATE (PF) 100 MCG/2ML IJ SOLN
INTRAMUSCULAR | Status: AC
  Filled 2016-11-11: qty 2

## 2016-11-11 MED ORDER — MIDAZOLAM HCL 2 MG/2ML IJ SOLN
INTRAMUSCULAR | Status: AC
  Filled 2016-11-11: qty 2

## 2016-11-11 MED ORDER — TETRACAINE HCL 0.5 % OP SOLN
1.0000 [drp] | OPHTHALMIC | Status: AC
  Administered 2016-11-11 (×3): 1 [drp] via OPHTHALMIC

## 2016-11-11 MED ORDER — LIDOCAINE HCL 3.5 % OP GEL
1.0000 "application " | Freq: Once | OPHTHALMIC | Status: AC
  Administered 2016-11-11: 1 via OPHTHALMIC

## 2016-11-11 MED ORDER — PROVISC 10 MG/ML IO SOLN
INTRAOCULAR | Status: DC | PRN
  Administered 2016-11-11: 0.85 mL via INTRAOCULAR

## 2016-11-11 MED ORDER — CYCLOPENTOLATE-PHENYLEPHRINE 0.2-1 % OP SOLN
1.0000 [drp] | OPHTHALMIC | Status: AC
  Administered 2016-11-11 (×3): 1 [drp] via OPHTHALMIC

## 2016-11-11 MED ORDER — PHENYLEPHRINE HCL 2.5 % OP SOLN
1.0000 [drp] | OPHTHALMIC | Status: AC
  Administered 2016-11-11 (×3): 1 [drp] via OPHTHALMIC

## 2016-11-11 MED ORDER — BSS IO SOLN
INTRAOCULAR | Status: DC | PRN
  Administered 2016-11-11: 15 mL via INTRAOCULAR

## 2016-11-11 MED ORDER — LACTATED RINGERS IV SOLN
INTRAVENOUS | Status: DC
  Administered 2016-11-11: 09:00:00 via INTRAVENOUS

## 2016-11-11 SURGICAL SUPPLY — 12 items
CLOTH BEACON ORANGE TIMEOUT ST (SAFETY) ×3 IMPLANT
EYE SHIELD UNIVERSAL CLEAR (GAUZE/BANDAGES/DRESSINGS) ×2 IMPLANT
GLOVE BIOGEL PI IND STRL 6.5 (GLOVE) IMPLANT
GLOVE BIOGEL PI IND STRL 7.0 (GLOVE) IMPLANT
GLOVE BIOGEL PI IND STRL 8 (GLOVE) IMPLANT
GLOVE BIOGEL PI INDICATOR 6.5 (GLOVE) ×2
GLOVE BIOGEL PI INDICATOR 7.0 (GLOVE) ×2
GLOVE BIOGEL PI INDICATOR 8 (GLOVE) ×2
PAD ARMBOARD 7.5X6 YLW CONV (MISCELLANEOUS) ×2 IMPLANT
SIGHTPATH CAT PROC W REG LENS (Ophthalmic Related) ×3 IMPLANT
SYRINGE LUER LOK 1CC (MISCELLANEOUS) ×2 IMPLANT
WATER STERILE IRR 250ML POUR (IV SOLUTION) ×2 IMPLANT

## 2016-11-11 NOTE — Anesthesia Preprocedure Evaluation (Signed)
Anesthesia Evaluation  Patient identified by MRN, date of birth, ID band Patient awake    Reviewed: Allergy & Precautions, H&P , NPO status , Patient's Chart, lab work & pertinent test results  Airway Mallampati: II  TM Distance: >3 FB Neck ROM: full    Dental  (+) Edentulous Upper, Missing, Dental Advisory Given Only 2 teeth left on bottom:   Pulmonary neg pulmonary ROS,    Pulmonary exam normal breath sounds clear to auscultation       Cardiovascular Exercise Tolerance: Good + DVT  negative cardio ROS   Rhythm:regular Rate:Normal     Neuro/Psych negative neurological ROS  negative psych ROS   GI/Hepatic negative GI ROS, Neg liver ROS,   Endo/Other  negative endocrine ROS  Renal/GU negative Renal ROS  negative genitourinary   Musculoskeletal   Abdominal   Peds  Hematology negative hematology ROS (+) Blood dyscrasia (Factor V Leiden mutation - coumadin), , factor V Leiden - on coumadin.  Multiple blood clots   Anesthesia Other Findings   Reproductive/Obstetrics negative OB ROS                             Anesthesia Physical Anesthesia Plan  ASA: III  Anesthesia Plan: MAC   Post-op Pain Management:    Induction:   Airway Management Planned: Nasal Cannula  Additional Equipment:   Intra-op Plan:   Post-operative Plan:   Informed Consent: I have reviewed the patients History and Physical, chart, labs and discussed the procedure including the risks, benefits and alternatives for the proposed anesthesia with the patient or authorized representative who has indicated his/her understanding and acceptance.     Plan Discussed with:   Anesthesia Plan Comments:         Anesthesia Quick Evaluation

## 2016-11-11 NOTE — Op Note (Signed)
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  Assistants: None  Anesthesia: Topical with MAC  Indications: Painless, progressive loss of vision with compromise of daily activities.  Surgery: Cataract Extraction with Intraocular lens Implant Left Eye  Discription: The patient had dilating drops and viscous lidocaine placed into the Left eye in the pre-op holding area. After transfer to the operating room, a time out was performed. The patient was then prepped and draped. Beginning with a 69 degree blade a paracentesis port was made at the surgeon's 2 o'clock position. The anterior chamber was then filled with 1% non-preserved lidocaine. This was followed by filling the anterior chamber with Provisc.  A 2.75m keratome blade was used to make a clear corneal incision at the temporal limbus.  A bent cystatome needle was used to create a continuous tear capsulotomy. Hydrodissection was performed with balanced salt solution on a Fine canula. The lens nucleus was then removed using the phacoemulsification handpiece. Residual cortex was removed with the I&A handpiece. The anterior chamber and capsular bag were refilled with Provisc. A posterior chamber intraocular lens was placed into the capsular bag with it's injector. The implant was positioned with the Kuglan hook. The Provisc was then removed from the anterior chamber and capsular bag with the I&A handpiece. Stromal hydration of the main incision and paracentesis port was performed with BSS on a Fine canula. The wounds were tested for leak which was negative. The patient tolerated the procedure well. There were no operative complications. The patient was then transferred to the recovery room in stable condition.  Complications: None  Specimen: None  EBL: None  Prosthetic device: Abbott Technis, PCB00, power  23.5, SN [id number]

## 2016-11-11 NOTE — Discharge Instructions (Signed)

## 2016-11-11 NOTE — Anesthesia Postprocedure Evaluation (Signed)
Anesthesia Post Note  Patient: Ian Cunningham  Procedure(s) Performed: Procedure(s) (LRB): CATARACT EXTRACTION PHACO AND INTRAOCULAR LENS PLACEMENT (IOC) CDE - (Left)  Patient location during evaluation: Short Stay Anesthesia Type: MAC Level of consciousness: awake and alert, oriented and patient cooperative Pain management: pain level controlled Vital Signs Assessment: post-procedure vital signs reviewed and stable Respiratory status: spontaneous breathing, nonlabored ventilation and respiratory function stable Cardiovascular status: blood pressure returned to baseline Postop Assessment: no signs of nausea or vomiting Anesthetic complications: no     Last Vitals:  Vitals:   11/11/16 0855 11/11/16 0900  BP: 135/65 133/67  Resp: (!) 37 (!) 23  Temp:      Last Pain:  Vitals:   11/11/16 0812  TempSrc: Oral                 Marilouise Densmore J

## 2016-11-11 NOTE — Transfer of Care (Signed)
Immediate Anesthesia Transfer of Care Note  Patient: Ian Cunningham  Procedure(s) Performed: Procedure(s) with comments: CATARACT EXTRACTION PHACO AND INTRAOCULAR LENS PLACEMENT (IOC) CDE - (Left) - left  Patient Location: Short Stay  Anesthesia Type:MAC  Level of Consciousness: awake  Airway & Oxygen Therapy: Patient Spontanous Breathing  Post-op Assessment: Report given to RN and Post -op Vital signs reviewed and stable  Post vital signs: Reviewed and stable  Last Vitals:  Vitals:   11/11/16 0855 11/11/16 0900  BP: 135/65 133/67  Resp: (!) 37 (!) 23  Temp:      Last Pain:  Vitals:   11/11/16 0812  TempSrc: Oral      Patients Stated Pain Goal: 5 ([id number])  Complications: No apparent anesthesia complications

## 2016-11-11 NOTE — H&P (Signed)
I have reviewed the H&P, the patient was re-examined, and I have identified no interval changes in medical condition and plan of care since the history and physical of record  

## 2016-11-15 ENCOUNTER — Encounter (HOSPITAL_COMMUNITY): Payer: Self-pay | Admitting: Ophthalmology

## 2016-11-25 ENCOUNTER — Encounter (HOSPITAL_COMMUNITY)
Admission: RE | Admit: 2016-11-25 | Discharge: 2016-11-25 | Disposition: A | Discharge status: Home / Self Care | Payer: Medicare Other | Source: Ambulatory Visit | Attending: Ophthalmology | Admitting: Ophthalmology

## 2016-11-25 ENCOUNTER — Encounter (HOSPITAL_COMMUNITY): Payer: Self-pay

## 2016-11-29 ENCOUNTER — Ambulatory Visit (HOSPITAL_COMMUNITY): Payer: Medicare Other | Admitting: Anesthesiology

## 2016-11-29 ENCOUNTER — Encounter (HOSPITAL_COMMUNITY): Payer: Self-pay | Admitting: *Deleted

## 2016-11-29 ENCOUNTER — Ambulatory Visit (HOSPITAL_COMMUNITY)
Admission: RE | Admit: 2016-11-29 | Discharge: 2016-11-29 | Disposition: A | Discharge status: Home / Self Care | Payer: Medicare Other | Source: Ambulatory Visit | Attending: Ophthalmology | Admitting: Ophthalmology

## 2016-11-29 ENCOUNTER — Encounter (HOSPITAL_COMMUNITY)
Admission: RE | Disposition: A | Discharge status: Home / Self Care | Payer: Self-pay | Source: Ambulatory Visit | Attending: Ophthalmology

## 2016-11-29 DIAGNOSIS — H2511 Age-related nuclear cataract, right eye: Secondary | ICD-10-CM | POA: Diagnosis present

## 2016-11-29 DIAGNOSIS — D759 Disease of blood and blood-forming organs, unspecified: Secondary | ICD-10-CM | POA: Insufficient documentation

## 2016-11-29 HISTORY — PX: CATARACT EXTRACTION W/PHACO: SHX586

## 2016-11-29 SURGERY — PHACOEMULSIFICATION, CATARACT, WITH IOL INSERTION
Anesthesia: Monitor Anesthesia Care | Site: Eye | Laterality: Right

## 2016-11-29 MED ORDER — MIDAZOLAM HCL 2 MG/2ML IJ SOLN
INTRAMUSCULAR | Status: AC
  Filled 2016-11-29: qty 2

## 2016-11-29 MED ORDER — EPINEPHRINE PF 1 MG/ML IJ SOLN
INTRAOCULAR | Status: DC | PRN
  Administered 2016-11-29: .8 mL via OPHTHALMIC

## 2016-11-29 MED ORDER — EPINEPHRINE PF 1 MG/ML IJ SOLN
INTRAOCULAR | Status: DC | PRN
  Administered 2016-11-29: 300 mL

## 2016-11-29 MED ORDER — MIDAZOLAM HCL 2 MG/2ML IJ SOLN
1.0000 mg | INTRAMUSCULAR | Status: AC
Start: 2016-11-29 — End: 2016-11-29
  Administered 2016-11-29 (×2): 2 mg via INTRAVENOUS

## 2016-11-29 MED ORDER — FENTANYL CITRATE (PF) 100 MCG/2ML IJ SOLN
INTRAMUSCULAR | Status: AC
  Filled 2016-11-29: qty 2

## 2016-11-29 MED ORDER — PROVISC 10 MG/ML IO SOLN
INTRAOCULAR | Status: DC | PRN
  Administered 2016-11-29: 0.85 mL via INTRAOCULAR

## 2016-11-29 MED ORDER — EPINEPHRINE PF 1 MG/ML IJ SOLN
INTRAMUSCULAR | Status: AC
  Filled 2016-11-29: qty 1

## 2016-11-29 MED ORDER — POVIDONE-IODINE 5 % OP SOLN
OPHTHALMIC | Status: DC | PRN
  Administered 2016-11-29: 1 via OPHTHALMIC

## 2016-11-29 MED ORDER — NEOMYCIN-POLYMYXIN-DEXAMETH 3.5-10000-0.1 OP SUSP
OPHTHALMIC | Status: DC | PRN
  Administered 2016-11-29: 1 [drp] via OPHTHALMIC

## 2016-11-29 MED ORDER — BSS IO SOLN
INTRAOCULAR | Status: DC | PRN
  Administered 2016-11-29: 15 mL via INTRAOCULAR

## 2016-11-29 MED ORDER — CYCLOPENTOLATE-PHENYLEPHRINE 0.2-1 % OP SOLN
1.0000 [drp] | OPHTHALMIC | Status: AC
  Administered 2016-11-29 (×3): 1 [drp] via OPHTHALMIC

## 2016-11-29 MED ORDER — FENTANYL CITRATE (PF) 100 MCG/2ML IJ SOLN
25.0000 ug | Freq: Once | INTRAMUSCULAR | Status: AC
  Administered 2016-11-29: 25 ug via INTRAVENOUS

## 2016-11-29 MED ORDER — LIDOCAINE HCL 3.5 % OP GEL
1.0000 "application " | Freq: Once | OPHTHALMIC | Status: AC
  Administered 2016-11-29: 1 via OPHTHALMIC

## 2016-11-29 MED ORDER — TETRACAINE HCL 0.5 % OP SOLN
1.0000 [drp] | OPHTHALMIC | Status: AC
  Administered 2016-11-29 (×3): 1 [drp] via OPHTHALMIC

## 2016-11-29 MED ORDER — LACTATED RINGERS IV SOLN
INTRAVENOUS | Status: DC
  Administered 2016-11-29: 11:00:00 via INTRAVENOUS

## 2016-11-29 MED ORDER — PHENYLEPHRINE HCL 2.5 % OP SOLN
1.0000 [drp] | OPHTHALMIC | Status: AC
  Administered 2016-11-29 (×3): 1 [drp] via OPHTHALMIC

## 2016-11-29 SURGICAL SUPPLY — 11 items
CLOTH BEACON ORANGE TIMEOUT ST (SAFETY) ×3 IMPLANT
EYE SHIELD UNIVERSAL CLEAR (GAUZE/BANDAGES/DRESSINGS) ×2 IMPLANT
GLOVE BIOGEL PI IND STRL 6.5 (GLOVE) IMPLANT
GLOVE BIOGEL PI IND STRL 7.0 (GLOVE) IMPLANT
GLOVE BIOGEL PI INDICATOR 6.5 (GLOVE) ×2
GLOVE BIOGEL PI INDICATOR 7.0 (GLOVE) ×4
LENS ALC ACRYL/TECN (Ophthalmic Related) ×3 IMPLANT
PAD ARMBOARD 7.5X6 YLW CONV (MISCELLANEOUS) ×2 IMPLANT
SYRINGE LUER LOK 1CC (MISCELLANEOUS) ×2 IMPLANT
TAPE TRANSPARENT 1/2IN (GAUZE/BANDAGES/DRESSINGS) ×2 IMPLANT
WATER STERILE IRR 250ML POUR (IV SOLUTION) ×2 IMPLANT

## 2016-11-29 NOTE — Discharge Instructions (Signed)
Moderate Conscious Sedation, Adult, Care After °These instructions provide you with information about caring for yourself after your procedure. Your health care provider may also give you more specific instructions. Your treatment has been planned according to current medical practices, but problems sometimes occur. Call your health care provider if you have any problems or questions after your procedure. °What can I expect after the procedure? °After your procedure, it is common: °· To feel sleepy for several hours. °· To feel clumsy and have poor balance for several hours. °· To have poor judgment for several hours. °· To vomit if you eat too soon. °Follow these instructions at home: °For at least 24 hours after the procedure:  ° °· Do not: °¨ Participate in activities where you could fall or become injured. °¨ Drive. °¨ Use heavy machinery. °¨ Drink alcohol. °¨ Take sleeping pills or medicines that cause drowsiness. °¨ Make important decisions or sign legal documents. °¨ Take care of children on your own. °· Rest. °Eating and drinking  °· Follow the diet recommended by your health care provider. °· If you vomit: °¨ Drink water, juice, or soup when you can drink without vomiting. °¨ Make sure you have little or no nausea before eating solid foods. °General instructions  °· Have a responsible adult stay with you until you are awake and alert. °· Take over-the-counter and prescription medicines only as told by your health care provider. °· If you smoke, do not smoke without supervision. °· Keep all follow-up visits as told by your health care provider. This is important. °Contact a health care provider if: °· You keep feeling nauseous or you keep vomiting. °· You feel light-headed. °· You develop a rash. °· You have a fever. °Get help right away if: °· You have trouble breathing. °This information is not intended to replace advice given to you by your health care provider. Make sure you discuss any questions you  have with your health care provider. °Document Released: 04/25/2013 Document Revised: 12/08/2015 Document Reviewed: 10/25/2015 °Elsevier Interactive Patient Education © 2017 Elsevier Inc[employer] ° °

## 2016-11-29 NOTE — Transfer of Care (Signed)
Immediate Anesthesia Transfer of Care Note  Patient: Ian Cunningham  Procedure(s) Performed: Procedure(s) with comments: CATARACT EXTRACTION PHACO AND INTRAOCULAR LENS PLACEMENT (IOC) (Right) - CDE: 18.15  Patient Location: Short Stay  Anesthesia Type:MAC  Level of Consciousness: awake and patient cooperative  Airway & Oxygen Therapy: Patient Spontanous Breathing  Post-op Assessment: Report given to RN  Post vital signs: Reviewed and stable  Last Vitals:  Vitals:   11/29/16 1100 11/29/16 1200  BP: (!) 163/72 (!) 130/59  Resp: 17 14  Temp:      Last Pain:  Vitals:   11/29/16 1055  TempSrc: Oral      Patients Stated Pain Goal: 5 (08/67/61 [id number])  Complications: No apparent anesthesia complications

## 2016-11-29 NOTE — H&P (View-Only) (Signed)
I have reviewed the H&P, the patient was re-examined, and I have identified no interval changes in medical condition and plan of care since the history and physical of record  

## 2016-11-29 NOTE — Anesthesia Preprocedure Evaluation (Signed)
Anesthesia Evaluation  Patient identified by MRN, date of birth, ID band Patient awake    Reviewed: Allergy & Precautions, H&P , NPO status , Patient's Chart, lab work & pertinent test results  Airway Mallampati: II  TM Distance: >3 FB Neck ROM: full    Dental  (+) Edentulous Upper, Missing, Dental Advisory Given Only 2 teeth left on bottom:   Pulmonary neg pulmonary ROS,    Pulmonary exam normal breath sounds clear to auscultation       Cardiovascular Exercise Tolerance: Good + DVT  negative cardio ROS   Rhythm:regular Rate:Normal     Neuro/Psych negative neurological ROS  negative psych ROS   GI/Hepatic negative GI ROS, Neg liver ROS,   Endo/Other  negative endocrine ROS  Renal/GU negative Renal ROS  negative genitourinary   Musculoskeletal   Abdominal   Peds  Hematology negative hematology ROS (+) Blood dyscrasia (Factor V Leiden mutation - coumadin), , factor V Leiden - on coumadin.  Multiple blood clots   Anesthesia Other Findings   Reproductive/Obstetrics negative OB ROS                             Anesthesia Physical Anesthesia Plan  ASA: III  Anesthesia Plan: MAC   Post-op Pain Management:    Induction:   Airway Management Planned: Nasal Cannula  Additional Equipment:   Intra-op Plan:   Post-operative Plan:   Informed Consent: I have reviewed the patients History and Physical, chart, labs and discussed the procedure including the risks, benefits and alternatives for the proposed anesthesia with the patient or authorized representative who has indicated his/her understanding and acceptance.     Plan Discussed with:   Anesthesia Plan Comments:         Anesthesia Quick Evaluation

## 2016-11-29 NOTE — Anesthesia Postprocedure Evaluation (Signed)
Anesthesia Post Note  Patient: Ian Cunningham  Procedure(s) Performed: Procedure(s) (LRB): CATARACT EXTRACTION PHACO AND INTRAOCULAR LENS PLACEMENT (IOC) (Right)  Patient location during evaluation: Short Stay Anesthesia Type: MAC Level of consciousness: awake and patient cooperative Pain management: pain level controlled Vital Signs Assessment: post-procedure vital signs reviewed and stable Respiratory status: spontaneous breathing, nonlabored ventilation and respiratory function stable Cardiovascular status: blood pressure returned to baseline Postop Assessment: no signs of nausea or vomiting Anesthetic complications: no     Last Vitals:  Vitals:   11/29/16 1100 11/29/16 1200  BP: (!) 163/72 (!) 130/59  Resp: 17 14  Temp:      Last Pain:  Vitals:   11/29/16 1055  TempSrc: Oral                 Manila Rommel J

## 2016-11-29 NOTE — Op Note (Signed)
Date of Admission: 11/29/2016  Date of Surgery: 11/29/2016  Pre-Op Dx: Cataract Right  Eye  Post-Op Dx: Senile Nuclear Cataract  Right  Eye,  Dx Code H25.11.  Surgeon: Tonny Branch, M.D.  Assistants: None  Anesthesia: Topical with MAC  Indications: Painless, progressive loss of vision with compromise of daily activities.  Surgery: Cataract Extraction with Intraocular lens Implant Right Eye  Discription: The patient had dilating drops and viscous lidocaine placed into the Right eye in the pre-op holding area. After transfer to the operating room, a time out was performed. The patient was then prepped and draped. Beginning with a 29 degree blade a paracentesis port was made at the surgeon's 2 o'clock position. The anterior chamber was then filled with 1% non-preserved lidocaine with epinepherine. This was followed by filling the anterior chamber with Provisc.  A 2.26m keratome blade was used to make a clear corneal incision at the temporal limbus.  A bent cystatome needle was used to create a continuous tear capsulotomy. Hydrodissection was performed with balanced salt solution on a Fine canula. The lens nucleus was then removed using the phacoemulsification handpiece. Residual cortex was removed with the I&A handpiece. The anterior chamber and capsular bag were refilled with Provisc. A posterior chamber intraocular lens was placed into the capsular bag with it's injector. The implant was positioned with the Kuglan hook. The Provisc was then removed from the anterior chamber and capsular bag with the I&A handpiece. Stromal hydration of the main incision and paracentesis port was performed with BSS on a Fine canula. The wounds were tested for leak which was negative. The patient tolerated the procedure well. There were no operative complications. The patient was then transferred to the recovery room in stable condition.  Complications: None  Specimen: None  EBL: None  Prosthetic device: Abbott  Technis, PCB00, power 23.5, SN [id number]

## 2016-11-29 NOTE — Interval H&P Note (Signed)
History and Physical Interval Note:  11/29/2016 11:57 AM  Ian Cunningham  has presented today for surgery, with the diagnosis of nuclear cataract right eye  The various methods of treatment have been discussed with the patient and family. After consideration of risks, benefits and other options for treatment, the patient has consented to  Procedure(s) with comments: CATARACT EXTRACTION PHACO AND INTRAOCULAR LENS PLACEMENT (IOC) (Right) - right - pt knows to arrive at 10:30 as a surgical intervention .  The patient's history has been reviewed, patient examined, no change in status, stable for surgery.  I have reviewed the patient's chart and labs.  Questions were answered to the patient's satisfaction.     Nile Dorning

## 2016-11-30 ENCOUNTER — Encounter (HOSPITAL_COMMUNITY): Payer: Self-pay | Admitting: Ophthalmology
